# Patient Record
Sex: Female | Born: 1970 | Race: White | Hispanic: No | Marital: Married | Smoking: Never smoker
Health system: Southern US, Community
[De-identification: ages and names within clinical notes are randomized; demographics above are authoritative.]

## PROBLEM LIST (undated history)

## (undated) DIAGNOSIS — J45909 Unspecified asthma, uncomplicated: Secondary | ICD-10-CM

## (undated) DIAGNOSIS — Z8619 Personal history of other infectious and parasitic diseases: Secondary | ICD-10-CM

## (undated) DIAGNOSIS — I10 Essential (primary) hypertension: Secondary | ICD-10-CM

## (undated) HISTORY — DX: Personal history of other infectious and parasitic diseases: Z86.19

## (undated) HISTORY — PX: TONSILLECTOMY: SUR1361

---
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
## 1998-12-29 ENCOUNTER — Inpatient Hospital Stay (HOSPITAL_COMMUNITY): Admission: AD | Admit: 1998-12-29 | Discharge: 1998-12-31 | Payer: Self-pay | Admitting: Obstetrics & Gynecology

## 1999-03-05 ENCOUNTER — Other Ambulatory Visit: Admission: RE | Admit: 1999-03-05 | Discharge: 1999-03-05 | Payer: Self-pay | Admitting: Obstetrics and Gynecology

## 2000-04-10 ENCOUNTER — Other Ambulatory Visit: Admission: RE | Admit: 2000-04-10 | Discharge: 2000-04-10 | Payer: Self-pay | Admitting: Obstetrics and Gynecology

## 2001-06-15 ENCOUNTER — Other Ambulatory Visit: Admission: RE | Admit: 2001-06-15 | Discharge: 2001-06-15 | Payer: Self-pay | Admitting: Obstetrics and Gynecology

## 2001-12-21 ENCOUNTER — Emergency Department (HOSPITAL_COMMUNITY): Admission: EM | Admit: 2001-12-21 | Discharge: 2001-12-22 | Payer: Self-pay | Admitting: Emergency Medicine

## 2002-01-07 ENCOUNTER — Inpatient Hospital Stay (HOSPITAL_COMMUNITY): Admission: AD | Admit: 2002-01-07 | Discharge: 2002-01-10 | Payer: Self-pay | Admitting: Obstetrics and Gynecology

## 2002-02-28 ENCOUNTER — Other Ambulatory Visit: Admission: RE | Admit: 2002-02-28 | Discharge: 2002-02-28 | Payer: Self-pay | Admitting: Obstetrics and Gynecology

## 2003-02-22 ENCOUNTER — Other Ambulatory Visit: Admission: RE | Admit: 2003-02-22 | Discharge: 2003-02-22 | Payer: Self-pay | Admitting: Obstetrics and Gynecology

## 2003-09-15 ENCOUNTER — Inpatient Hospital Stay (HOSPITAL_COMMUNITY): Admission: AD | Admit: 2003-09-15 | Discharge: 2003-09-15 | Payer: Self-pay | Admitting: Obstetrics and Gynecology

## 2003-09-16 ENCOUNTER — Inpatient Hospital Stay (HOSPITAL_COMMUNITY): Admission: AD | Admit: 2003-09-16 | Discharge: 2003-09-18 | Payer: Self-pay | Admitting: *Deleted

## 2003-10-24 ENCOUNTER — Other Ambulatory Visit: Admission: RE | Admit: 2003-10-24 | Discharge: 2003-10-24 | Payer: Self-pay | Admitting: Obstetrics and Gynecology

## 2005-07-14 HISTORY — PX: BREAST SURGERY: SHX581

## 2013-09-16 ENCOUNTER — Encounter (HOSPITAL_COMMUNITY): Payer: Self-pay | Admitting: Emergency Medicine

## 2013-09-16 ENCOUNTER — Emergency Department (HOSPITAL_COMMUNITY)
Admission: EM | Admit: 2013-09-16 | Discharge: 2013-09-16 | Disposition: A | Discharge status: Home / Self Care | Payer: 59 | Source: Home / Self Care | Attending: Family Medicine | Admitting: Family Medicine

## 2013-09-16 DIAGNOSIS — J029 Acute pharyngitis, unspecified: Secondary | ICD-10-CM

## 2013-09-16 DIAGNOSIS — J019 Acute sinusitis, unspecified: Secondary | ICD-10-CM

## 2013-09-16 HISTORY — DX: Unspecified asthma, uncomplicated: J45.909

## 2013-09-16 MED ORDER — IBUPROFEN 800 MG PO TABS
800.0000 mg | ORAL_TABLET | Freq: Once | ORAL | Status: AC
  Administered 2013-09-16: 800 mg via ORAL

## 2013-09-16 MED ORDER — IBUPROFEN 800 MG PO TABS
ORAL_TABLET | ORAL | Status: AC
  Filled 2013-09-16: qty 1

## 2013-09-16 MED ORDER — AMOXICILLIN 500 MG PO CAPS: 1000.0000 mg | ORAL_CAPSULE | Freq: Three times a day (TID) | ORAL | Status: DC

## 2013-09-16 MED ORDER — IBUPROFEN 800 MG PO TABS: 800.0000 mg | ORAL_TABLET | Freq: Once | ORAL | Status: DC

## 2013-09-16 NOTE — ED Provider Notes (Signed)
CSN: [id number]     Arrival date & time 09/16/13  0915 History   First MD Initiated Contact with Patient 09/16/13 0957     Chief Complaint  Patient presents with  . URI    Patient is a 43 y.o. female presenting with URI. The history is provided by the patient.  URI Presenting symptoms: congestion, ear pain, facial pain, fatigue, fever and sore throat   Severity:  Moderate Onset quality:  Gradual Duration:  5 days Timing:  Constant Progression:  Worsening Chronicity:  New Relieved by:  Nothing Ineffective treatments:  OTC medications Associated symptoms: neck pain, sinus pain and swollen glands   Associated symptoms: no arthralgias, no headaches, no myalgias, no sneezing and no wheezing   Risk factors: chronic respiratory disease   Risk factors: not elderly, no chronic cardiac disease, no chronic kidney disease, no diabetes mellitus, no immunosuppression, no recent illness, no recent travel and no sick contacts   Onset of sinus congestion Monday that has not been relieved w/ Netty Pot and several OTC meds. Now with bil ear "pressure", facial pain R>L, sore throat and sore neck glands. Sx's associated w/ fever and chills, t-max at home unknown. Denies N/V/D or other associated sx's.  Past Medical History  Diagnosis Date  . Asthma    Past Surgical History  Procedure Laterality Date  . Tonsillectomy    . Breast surgery     No family history on file. History  Substance Use Topics  . Smoking status: Never Smoker   . Smokeless tobacco: Not on file  . Alcohol Use: No   OB History   Grav Para Term Preterm Abortions TAB SAB Ect Mult Living                 Review of Systems  Constitutional: Positive for fever, chills and fatigue.  HENT: Positive for congestion, ear pain, sinus pressure and sore throat. Negative for sneezing and trouble swallowing.   Eyes: Negative.   Respiratory: Negative.  Negative for wheezing.   Cardiovascular: Negative.   Gastrointestinal: Negative.    Endocrine: Negative.   Genitourinary: Negative.   Musculoskeletal: Positive for neck pain. Negative for arthralgias and myalgias.  Allergic/Immunologic: Negative.   Neurological: Negative.  Negative for headaches.  Hematological: Negative.   Psychiatric/Behavioral: Negative.     Allergies  Review of patient's allergies indicates no known allergies.  Home Medications   Current Outpatient Rx  Name  Route  Sig  Dispense  Refill  . albuterol-ipratropium (COMBIVENT) 18-103 MCG/ACT inhaler   Inhalation   Inhale into the lungs every 4 (four) hours.         . montelukast (SINGULAIR) 10 MG tablet   Oral   Take 10 mg by mouth at bedtime.         Marland Kitchen. amoxicillin (AMOXIL) 500 MG capsule   Oral   Take 2 capsules (1,000 mg total) by mouth 3 (three) times daily.   60 capsule   0    BP 145/87  Pulse 114  Temp(Src) 101.6 F (38.7 C) (Oral)  Resp 20  SpO2 100%  LMP 09/10/2013 Physical Exam  Nursing note and vitals reviewed. Constitutional: She is oriented to person, place, and time. She appears well-developed and well-nourished. No distress.  HENT:  Head: Normocephalic and atraumatic.  Right Ear: Tympanic membrane, external ear and ear canal normal.  Left Ear: Tympanic membrane and ear canal normal.  Nose: Mucosal edema present. Right sinus exhibits maxillary sinus tenderness and frontal sinus tenderness. Left sinus  exhibits maxillary sinus tenderness and frontal sinus tenderness.  Mouth/Throat: Uvula is midline and mucous membranes are normal. Mucous membranes are not dry. Posterior oropharyngeal edema and posterior oropharyngeal erythema present. No tonsillar abscesses.  Eyes: Conjunctivae are normal.  Neck: Normal range of motion.  Cardiovascular: Regular rhythm.  Tachycardia present.  Exam reveals no gallop and no friction rub.   No murmur heard. Likely driven by fever.   Pulmonary/Chest: Effort normal and breath sounds normal.  Lymphadenopathy:    She has cervical  adenopathy.  Neurological: She is alert and oriented to person, place, and time.  Skin: Skin is warm and dry. No rash noted.  Psychiatric: She has a normal mood and affect.    ED Course  Procedures (including critical care time) Labs Review Labs Reviewed - No data to display Imaging Review No results found.   MDM   1. Sinusitis, acute   2. Pharyngitis    Approx 1 week h/o sore throat and sinus congestion associated w/ pain and fever. Symptoms refractory to OTC remedies. Will treat w/ Amoxicillin 1 gm TID x 10 days and encourage pt to continue Tylenol and Ibuprofen for fever and other palliative measures already using. Pt verbalizes understanding and is agreeable w/ plan.  Leanne Chang, NP 09/16/13 1408

## 2013-09-16 NOTE — Discharge Instructions (Signed)
Please read over the instructions below for home care. Take the medication as directed and be sure to finish. Continue netty pot and saline nasal spray (or Afrin spray if needed) until sinus congestion improves.  Alternate Tylenol and Ibuprofen for fever and pain.   Pharyngitis Pharyngitis is a sore throat (pharynx). There is redness, pain, and swelling of your throat. HOME CARE   Drink enough fluids to keep your pee (urine) clear or pale yellow.  Only take medicine as told by your doctor.  You may get sick again if you do not take medicine as told. Finish your medicines, even if you start to feel better.  Do not take aspirin.  Rest.  Rinse your mouth (gargle) with salt water ( tsp of salt per 1 qt of water) every 1 2 hours. This will help the pain.  If you are not at risk for choking, you can suck on hard candy or sore throat lozenges. GET HELP IF:  You have large, tender lumps on your neck.  You have a rash.  You cough up green, yellow-brown, or bloody spit. GET HELP RIGHT AWAY IF:   You have a stiff neck.  You drool or cannot swallow liquids.  You throw up (vomit) or are not able to keep medicine or liquids down.  You have very bad pain that does not go away with medicine.  You have problems breathing (not from a stuffy nose). MAKE SURE YOU:   Understand these instructions.  Will watch your condition.  Will get help right away if you are not doing well or get worse. Document Released: 12/17/2007 Document Revised: 04/20/2013 Document Reviewed: 03/07/2013 [facility] Patient Information 2014 [place], [place].  Salt Water Gargle This solution will help make your mouth and throat feel better. HOME CARE INSTRUCTIONS   Mix 1 teaspoon of salt in 8 ounces of warm water.  Gargle with this solution as much or often as you need or as directed. Swish and gargle gently if you have any sores or wounds in your mouth.  Do not swallow this mixture. Document Released: 04/03/2004  Document Revised: 09/22/2011 Document Reviewed: 08/25/2008 [facility] Patient Information 2014 [place], [place].  Sinusitis Sinusitis is redness, soreness, and puffiness (inflammation) of the air pockets in the bones of your face (sinuses). The redness, soreness, and puffiness can cause air and mucus to get trapped in your sinuses. This can allow germs to grow and cause an infection.  HOME CARE   Drink enough fluids to keep your pee (urine) clear or pale yellow.  Use a humidifier in your home.  Run a hot shower to create steam in the bathroom. Sit in the bathroom with the door closed. Breathe in the steam 3 4 times a day.  Put a warm, moist washcloth on your face 3 4 times a day, or as told by your doctor.  Use salt water sprays (saline sprays) to wet the thick fluid in your nose. This can help the sinuses drain.  Only take medicine as told by your doctor. GET HELP RIGHT AWAY IF:   Your pain gets worse.  You have very bad headaches.  You are sick to your stomach (nauseous).  You throw up (vomit).  You are very sleepy (drowsy) all the time.  Your face is puffy (swollen).  Your vision changes.  You have a stiff neck.  You have trouble breathing. MAKE SURE YOU:   Understand these instructions.  Will watch your condition.  Will get help right away if you  are not doing well or get worse. Document Released: 12/17/2007 Document Revised: 03/24/2012 Document Reviewed: 02/03/2012 [facility] Patient Information 2014 [place], [place].  Upper Respiratory Infection, Adult An upper respiratory infection (URI) is also known as the common cold. It is often caused by a type of germ (virus). Colds are easily spread (contagious). You can pass it to others by kissing, coughing, sneezing, or drinking out of the same glass. Usually, you get better in 1 or 2 weeks.  HOME CARE   Only take medicine as told by your doctor.  Use a warm mist humidifier or breathe in steam from a hot  shower.  Drink enough water and fluids to keep your pee (urine) clear or pale yellow.  Get plenty of rest.  Return to work when your temperature is back to normal or as told by your doctor. You may use a face mask and wash your hands to stop your cold from spreading. GET HELP RIGHT AWAY IF:   After the first few days, you feel you are getting worse.  You have questions about your medicine.  You have chills, shortness of breath, or brown or red spit (mucus).  You have yellow or brown snot (nasal discharge) or pain in the face, especially when you bend forward.  You have a fever, puffy (swollen) neck, pain when you swallow, or white spots in the back of your throat.  You have a bad headache, ear pain, sinus pain, or chest pain.  You have a high-pitched whistling sound when you breathe in and out (wheezing).  You have a lasting cough or cough up blood.  You have sore muscles or a stiff neck. MAKE SURE YOU:   Understand these instructions.  Will watch your condition.  Will get help right away if you are not doing well or get worse. Document Released: 12/17/2007 Document Revised: 09/22/2011 Document Reviewed: 11/04/2010 [facility] Patient Information 2014 [place], [place].

## 2013-09-16 NOTE — ED Notes (Signed)
Patient complains of head congestion, sinus and ear pressure; also sore throat since Monday with fever/chills; denies nausea/vomitting, diarrhea.

## 2013-09-16 NOTE — ED Provider Notes (Signed)
Medical screening examination/treatment/procedure(s) were performed by resident physician or non-physician practitioner and as supervising physician I was immediately available for consultation/collaboration.   KINDL,JAMES DOUGLAS MD.   James D Kindl, MD 09/16/13 1709 

## 2014-03-15 ENCOUNTER — Emergency Department (HOSPITAL_COMMUNITY)
Admission: EM | Admit: 2014-03-15 | Discharge: 2014-03-15 | Disposition: A | Discharge status: Home / Self Care | Payer: 59 | Source: Home / Self Care | Attending: Family Medicine | Admitting: Family Medicine

## 2014-03-15 ENCOUNTER — Encounter (HOSPITAL_COMMUNITY): Payer: Self-pay | Admitting: Emergency Medicine

## 2014-03-15 DIAGNOSIS — H00036 Abscess of eyelid left eye, unspecified eyelid: Secondary | ICD-10-CM

## 2014-03-15 DIAGNOSIS — H00039 Abscess of eyelid unspecified eye, unspecified eyelid: Secondary | ICD-10-CM

## 2014-03-15 MED ORDER — TOBRAMYCIN 0.3 % OP SOLN
OPHTHALMIC | Status: AC
  Filled 2014-03-15: qty 5

## 2014-03-15 MED ORDER — TOBRAMYCIN 0.3 % OP SOLN
2.0000 [drp] | Freq: Once | OPHTHALMIC | Status: AC
  Administered 2014-03-15: 2 [drp] via OPHTHALMIC

## 2014-03-15 NOTE — ED Provider Notes (Signed)
CSN: [id number]     Arrival date & time 03/15/14  1915 History   First MD Initiated Contact with Patient 03/15/14 1946     Chief Complaint  Patient presents with  . Eye Problem   (Consider location/radiation/quality/duration/timing/severity/associated sxs/prior Treatment) HPI Comments: Patient reports three of her children have recently had cutaneous abscesses that required recent incision and drainage. Patient reports that she developed redness and swelling of left upper medial eyelid 3 days ago that has become progressively worse with some cracking and drainage of skin at medial canthus. Denies contact lens use, fever, vision changes, pain with eye movement.  PCP: in [place], [place] Reports herself to be otherwise healthy.   Patient is a 43 y.o. female presenting with eye problem.  Eye Problem Location:  L eye   Past Medical History  Diagnosis Date  . Asthma    Past Surgical History  Procedure Laterality Date  . Tonsillectomy    . Breast surgery     History reviewed. No pertinent family history. History  Substance Use Topics  . Smoking status: Never Smoker   . Smokeless tobacco: Not on file  . Alcohol Use: No   OB History   Grav Para Term Preterm Abortions TAB SAB Ect Mult Living                 Review of Systems  All other systems reviewed and are negative.   Allergies  Review of patient's allergies indicates no known allergies.  Home Medications   Prior to Admission medications   Medication Sig Start Date End Date Taking? Authorizing Provider  albuterol-ipratropium (COMBIVENT) 18-103 MCG/ACT inhaler Inhale into the lungs every 4 (four) hours.   Yes Historical Provider, MD  montelukast (SINGULAIR) 10 MG tablet Take 10 mg by mouth at bedtime.   Yes Historical Provider, MD  amoxicillin (AMOXIL) 500 MG capsule Take 2 capsules (1,000 mg total) by mouth 3 (three) times daily. 09/16/13   Roma Kayser Schorr, NP   BP 140/94  Pulse 79  Temp(Src) 99.4 F (37.4 C) (Oral)   Resp 16  SpO2 98%  LMP 03/14/2014 Physical Exam  Nursing note and vitals reviewed. Constitutional: She is oriented to person, place, and time. She appears well-developed and well-nourished. No distress.  HENT:  Head: Normocephalic and atraumatic.  Eyes: Conjunctivae and EOM are normal. Pupils are equal, round, and reactive to light. Right eye exhibits no chemosis, no discharge, no exudate and no hordeolum. No scleral icterus.    Neck: Normal range of motion.  Cardiovascular: Normal rate.   Pulmonary/Chest: Effort normal.  Neurological: She is alert and oriented to person, place, and time.  Skin: Skin is warm and dry.  Psychiatric: She has a normal mood and affect. Her behavior is normal.    ED Course  Procedures (including critical care time) Labs Review Labs Reviewed - No data to display  Imaging Review No results found.   MDM   1. Cellulitis of eyelid, left    Will cover for possible cutaneous skin infection with Tobrex opthalmic drops (2 gtts in left eye Q4hrs x 7 days). Meds provided at [facility]. PCP follow up if no improvement. Warm compresses TID until healed.     Ria Clock, [place] 03/15/14 2021

## 2014-03-15 NOTE — ED Notes (Signed)
C/o left upper eyelid irritated, puffy. Denies visual disturbances. Both kids in home recently had staph infections , and she is concerned for poss skin infection. Denies pain

## 2014-03-15 NOTE — Discharge Instructions (Signed)
Tobrex opthalmic 2 drops in left eye every 4 hours for the next 7 days. Warm compresses three time a day until healed. Follow up with your doctor if symptoms do not improve.    Periorbital Cellulitis Periorbital cellulitis is a common infection that can affect the eyelid and the soft tissues that surround the eyeball. The infection may also affect the structures that produce and drain tears. It does not affect the eyeball itself. Natural tissue barriers usually prevent the spread of this infection to the eyeball and other deeper areas of the eye socket.  CAUSES  Bacterial infection.  Long-term (chronic) sinus infections.  An object (foreign body) stuck behind the eye.  An injury that goes through the eyelid tissues.  An injury that causes an infection, such as an insect sting.  Fracture of the bone around the eye.  Infections which have spread from the eyelid or other structures around the eye.  Bite wounds.  Inflammation or infection of the lining membranes of the brain (meningitis).  An infection in the blood (septicemia).  Dental infection (abscess).  Viral infection (this is rare). SYMPTOMS Symptoms usually come on suddenly.  Pain in the eye.  Red, hot, and swollen eyelids and possibly cheeks. The swelling is sometimes bad enough that the eyelids cannot open. Some infections make the eyelids look purple.  Fever and feeling generally ill.  Pain when touching the area around the eye. DIAGNOSIS  Periorbital cellulitis can be diagnosed from an eye exam. In severe cases, your caregiver might suggest:  Blood tests.  Imaging tests (such as a CT scan) to examine the sinuses and the area around and behind the eyeball. TREATMENT If your caregiver feels that you do not have any signs of serious infection, treatment may include:  Antibiotics.  Nasal decongestants to reduce swelling.  Referral to a dentist if it is suspected that the infection was caused by a prior  tooth infection.  Examination every day to make sure the problem is improving. HOME CARE INSTRUCTIONS  Take your antibiotics as directed. Finish them even if you start to feel better.  Some pain is normal with this condition. Take pain medicine as directed by your caregiver. Only take pain medicines approved by your caregiver.  It is important to drink fluids. Drink enough water and fluids to keep your urine clear or pale yellow.  Do not smoke.  Rest and get plenty of sleep.  Mild or moderate fevers generally have no long-term effects and often do not require treatment.  If your caregiver has given you a follow-up appointment, it is very important to keep that appointment. Your caregiver will need to make sure that the infection is getting better. It is important to check that a more serious infection is not developing. SEEK IMMEDIATE MEDICAL CARE IF:  Your eyelids become more painful, red, warm, or swollen.  You develop double vision or your vision becomes blurred or worsens in any way.  You have trouble moving your eyes.  The eye looks like it is popping out (proptosis).  You develop a severe headache, severe neck pain, or neck stiffness.  You develop repeated vomiting.  You have a fever or persistent symptoms for more than 72 hours.  You have a fever and your symptoms suddenly get worse. MAKE SURE YOU:  Understand these instructions.  Will watch your condition.  Will get help right away if you are not doing well or get worse. Document Released: 08/02/2010 Document Revised: 09/22/2011 Document Reviewed: 08/02/2010 ExitCare  Patient Information ©2015 [employer], LLC. This information is not intended to replace advice given to you by your health care provider. Make sure you discuss any questions you have with your health care provider. ° °

## 2014-03-16 NOTE — ED Provider Notes (Signed)
Medical screening examination/treatment/procedure(s) were performed by a resident physician or non-physician practitioner and as the supervising physician I was immediately available for consultation/collaboration.  Karolee Meloni, MD Family Medicine   Makinlee Awwad J Osa Campoli, MD 03/16/14 1800 

## 2014-12-22 ENCOUNTER — Other Ambulatory Visit: Payer: Self-pay | Admitting: Family Medicine

## 2014-12-25 ENCOUNTER — Encounter: Payer: Self-pay | Admitting: Family Medicine

## 2014-12-25 ENCOUNTER — Ambulatory Visit (INDEPENDENT_AMBULATORY_CARE_PROVIDER_SITE_OTHER): Payer: 59 | Admitting: Family Medicine

## 2014-12-25 VITALS — BP 120/80 | HR 89 | Temp 99.0°F | Resp 16 | Ht 65.0 in | Wt 211.0 lb

## 2014-12-25 DIAGNOSIS — J301 Allergic rhinitis due to pollen: Secondary | ICD-10-CM | POA: Diagnosis not present

## 2014-12-25 DIAGNOSIS — J4530 Mild persistent asthma, uncomplicated: Secondary | ICD-10-CM | POA: Diagnosis not present

## 2014-12-25 DIAGNOSIS — E669 Obesity, unspecified: Secondary | ICD-10-CM | POA: Insufficient documentation

## 2014-12-25 DIAGNOSIS — Z8249 Family history of ischemic heart disease and other diseases of the circulatory system: Secondary | ICD-10-CM | POA: Insufficient documentation

## 2014-12-25 DIAGNOSIS — K219 Gastro-esophageal reflux disease without esophagitis: Secondary | ICD-10-CM | POA: Insufficient documentation

## 2014-12-25 MED ORDER — IPRATROPIUM-ALBUTEROL 20-100 MCG/ACT IN AERS: 1.0000 | INHALATION_SPRAY | Freq: Four times a day (QID) | RESPIRATORY_TRACT | Status: DC | PRN

## 2014-12-25 MED ORDER — FLUTICASONE PROPIONATE HFA 110 MCG/ACT IN AERO: 2.0000 | INHALATION_SPRAY | Freq: Every day | RESPIRATORY_TRACT | Status: DC

## 2014-12-25 NOTE — Progress Notes (Signed)
   Patient: Wendy Shannon Female    DOB: 03-Feb-1971   44 y.o.   MRN: [id number] Visit Date: 12/25/2014  Today's Provider: Mila Merry, MD   No chief complaint on file.  Subjective:     Last seen for Asthma 04/25/2015=no changes were made.  Asthma She complains of shortness of breath and wheezing. There is no chest tightness, cough, difficulty breathing, frequent throat clearing or sputum production. This is a chronic problem. The current episode started more than 1 year ago. The problem occurs every several days. The problem has been unchanged. Pertinent negatives include no appetite change, chest pain, dyspnea on exertion, ear congestion, ear pain, fever, headaches, heartburn, malaise/fatigue, nasal congestion, sneezing or trouble swallowing. Her symptoms are aggravated by change in weather and pollen. She reports significant improvement on treatment. Her past medical history is significant for asthma.   She states that Singulair is working very well to keep allergies under control and having to use inhalers much less frequently since she starting taking it. She uses Combivent 3-4 days a week most of the year, but nearly every day during the fall and spring allergy seasons, It remains very effective. She does wake at night with symptoms 1-2 times per month.    Previous Medications   ALBUTEROL-IPRATROPIUM (COMBIVENT) 18-103 MCG/ACT INHALER    Inhale into the lungs every 4 (four) hours.   AMOXICILLIN (AMOXIL) 500 MG CAPSULE    Take 2 capsules (1,000 mg total) by mouth 3 (three) times daily.   COMBIVENT RESPIMAT 20-100 MCG/ACT AERS RESPIMAT    Use 1 puff 4 times daily   MONTELUKAST (SINGULAIR) 10 MG TABLET    Take 10 mg by mouth at bedtime.    Review of Systems  Constitutional: Negative for fever, malaise/fatigue and appetite change.  HENT: Negative for ear pain, sneezing and trouble swallowing.   Respiratory: Positive for shortness of breath and wheezing. Negative for cough and sputum  production.   Cardiovascular: Negative for chest pain and dyspnea on exertion.  Gastrointestinal: Negative for heartburn.  Neurological: Negative for headaches.    History  Substance Use Topics  . Smoking status: Never Smoker   . Smokeless tobacco: Not on file  . Alcohol Use: No   Objective:   There were no vitals taken for this visit.  Physical Exam  General Appearance:    Alert, cooperative, no distress  Eyes:    PERRL, conjunctiva/corneas clear, EOM's intact       Lungs:     Clear to auscultation bilaterally, respirations unlabored  Heart:    Regular rate and rhythm  Neurologic:   Awake, alert, oriented x 3. No apparent focal neurological           defect.            Assessment & Plan:     1. Mild persistent asthma in adult without complication Combivent continues to work well with Singulair for maintenance, but still having sx several times a weeks. Advised her to start maintenance inhaler whenever she has sx 3 or more times a weeks, especially during the allergy season when she has daily symptoms.   Counseled patient that she should have pneumonia vaccine to reduce risk of asthma exacerbations and LRI. She declined to get this today, but said she will think about it.   2. Allergic rhinitis due to pollen Continue Singulair daily which remains effective.

## 2015-01-17 ENCOUNTER — Encounter: Payer: Self-pay | Admitting: Family Medicine

## 2015-01-17 ENCOUNTER — Ambulatory Visit (INDEPENDENT_AMBULATORY_CARE_PROVIDER_SITE_OTHER): Payer: 59 | Admitting: Family Medicine

## 2015-01-17 VITALS — BP 102/80 | HR 64 | Temp 98.5°F | Resp 16 | Ht 65.0 in | Wt 212.0 lb

## 2015-01-17 DIAGNOSIS — J0100 Acute maxillary sinusitis, unspecified: Secondary | ICD-10-CM

## 2015-01-17 MED ORDER — AMOXICILLIN-POT CLAVULANATE 875-125 MG PO TABS: 1.0000 | ORAL_TABLET | Freq: Two times a day (BID) | ORAL | Status: DC

## 2015-01-17 NOTE — Progress Notes (Signed)
Subjective:     Patient ID: Wendy Shannon, female   DOB: 07-30-1970, 44 y.o.   MRN: [id number]  HPI  Chief Complaint  Patient presents with  . Sinus Problem    patient comes in office today with concerns of sinus pain and pressure since 01/07/15. Patient complains of sinus pain on right side of face behind her eye and pain and pressure in right ear. [place] and reports cold like symptoms during that time of travel  Patient reports increased right sided sinus pressure and purulent sinus drainage.   Review of Systems  Constitutional: Negative for fever and chills.  Respiratory: Negative for cough.        Asthma under control with Singulair.       Objective:   Physical Exam Ears: T.M's intact without inflammation. R TM dull in appearance Sinuses: non-tender Throat: no tonsillar enlargement or exudate Neck: no cervical adenopathy Lungs: clear    Assessment:    1. Acute maxillary sinusitis, recurrence not specified - amoxicillin-clavulanate (AUGMENTIN) 875-125 MG per tablet; Take 1 tablet by mouth 2 (two) times daily.  Dispense: 20 tablet; Refill: 0    Plan:    discussed use of Mucinex D

## 2015-01-17 NOTE — Patient Instructions (Signed)
Try Mucinex D.

## 2015-03-15 ENCOUNTER — Ambulatory Visit (INDEPENDENT_AMBULATORY_CARE_PROVIDER_SITE_OTHER): Payer: 59 | Admitting: Family Medicine

## 2015-03-15 ENCOUNTER — Encounter: Payer: Self-pay | Admitting: Family Medicine

## 2015-03-15 VITALS — BP 112/76 | HR 85 | Temp 98.0°F | Resp 14 | Ht 65.5 in | Wt 208.8 lb

## 2015-03-15 DIAGNOSIS — S46912A Strain of unspecified muscle, fascia and tendon at shoulder and upper arm level, left arm, initial encounter: Secondary | ICD-10-CM

## 2015-03-15 MED ORDER — HYDROCODONE-ACETAMINOPHEN 5-325 MG PO TABS: ORAL_TABLET | ORAL | Status: DC

## 2015-03-15 NOTE — Patient Instructions (Addendum)
Discussed scheduling of Aleve two pills twice daily with addition of heat for 20 minutes several x day. Continue to minimize lifting and pulling.

## 2015-03-15 NOTE — Progress Notes (Signed)
Subjective:     Patient ID: Wendy Shannon, female   DOB: [birth year], 44 y.o.   MRN: [id number]  HPI  Chief Complaint  Patient presents with  . Shoulder Pain    Patient comes in office today with concerns of shoulder pain on the left side that has been radiating down arm. Patient states that pain began 3 weeks ago she denies injury or oversue of arm., she describes pain as sharp/aching. Patient reports that she has heard "popping" at times in her shoulder, she has tried taking otc Aleve for relief.  States she has been taking Aleve two pills 3 - 4 x day. 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 Believes that may have exacerbated her sx. Tends to localize over her left AC joint area.   Review of Systems     Objective:   Physical Exam  Constitutional: She appears well-developed and well-nourished. No distress.  Musculoskeletal:  Cervical FROM. Left shoulder AROM with increased pain > 90 degrees. Passive FROM. Mild tenderness over the [facility] joint area though no crepitus appreciated on palpation. Left shoulder strength 5/5.       Assessment:    1. Shoulder strain, left, initial encounter - HYDROcodone-acetaminophen (NORCO/VICODIN) 5-325 MG per tablet; One every 4-6 hours as needed for pain  Dispense: 28 tablet; Refill: 0    Plan:    Continue two Aleve twice daily with food. Minimize lifting/pulling. Consider x-ray and orthopedic referral if not improving over the next 1-2 weeks

## 2015-04-27 ENCOUNTER — Encounter: Payer: Self-pay | Admitting: Family Medicine

## 2015-04-27 ENCOUNTER — Ambulatory Visit (INDEPENDENT_AMBULATORY_CARE_PROVIDER_SITE_OTHER): Payer: BC Managed Care – PPO | Admitting: Family Medicine

## 2015-04-27 VITALS — BP 128/82 | HR 80 | Temp 98.7°F | Resp 16 | Ht 65.5 in | Wt 215.0 lb

## 2015-04-27 DIAGNOSIS — J069 Acute upper respiratory infection, unspecified: Secondary | ICD-10-CM | POA: Diagnosis not present

## 2015-04-27 NOTE — Patient Instructions (Signed)
Try Mucinex D. If you develop cough add Delsym.

## 2015-04-27 NOTE — Progress Notes (Signed)
Subjective:     Patient ID: Wendy Shannon, female   DOB: [birth year], 44 y.o.   MRN: [id number]  HPI  Chief Complaint  Patient presents with  . Sore Throat    X 5 days. Patient reports that she woke up with a sore throat and cough. Patient also reports that she has been really hoarse. She has felt feverish, but did not take temp. She has only been taking OTC cold medication for symptoms .   Marland Kitchen. Rash    Patient also has a rash that has developed on her upper extremities. Patient reports that it is mildly itchy.   Denies sinus congestion or cough. Accompanied by her son.   Review of Systems  Respiratory:       States she remains on Singulair and uses Combivent one puff daily.       Objective:   Physical Exam  Constitutional: She appears well-developed and well-nourished. No distress.  Ears: T.M's intact without inflammation Throat: tonsils absent, no erythema Neck: no cervical adenopathy Lungs: clear Skin: slightly raised erythematous rash on her posterior neck and upper back    Assessment:    1. Upper respiratory infection with viral exanthem    Plan:    Discussed use of Mucinex D and Delsym for cough

## 2015-05-29 ENCOUNTER — Other Ambulatory Visit: Payer: Self-pay | Admitting: Family Medicine

## 2015-10-16 ENCOUNTER — Other Ambulatory Visit: Payer: Self-pay | Admitting: Family Medicine

## 2015-10-17 ENCOUNTER — Other Ambulatory Visit: Payer: Self-pay | Admitting: Family Medicine

## 2015-10-17 MED ORDER — MONTELUKAST SODIUM 10 MG PO TABS: 10.0000 mg | ORAL_TABLET | Freq: Every day | ORAL | Status: DC

## 2015-10-17 NOTE — Telephone Encounter (Signed)
rx failed to send to pharmacy. Resent rx.

## 2015-10-17 NOTE — Addendum Note (Signed)
Addended by: Marlene LardMILLER, APRIL M on: 10/17/2015 02:20 PM   Modules accepted: Orders

## 2016-01-25 ENCOUNTER — Other Ambulatory Visit: Payer: Self-pay

## 2016-01-25 MED ORDER — IPRATROPIUM-ALBUTEROL 20-100 MCG/ACT IN AERS: 1.0000 | INHALATION_SPRAY | Freq: Four times a day (QID) | RESPIRATORY_TRACT | Status: DC | PRN

## 2016-03-18 ENCOUNTER — Ambulatory Visit (INDEPENDENT_AMBULATORY_CARE_PROVIDER_SITE_OTHER): Payer: BC Managed Care – PPO

## 2016-03-18 ENCOUNTER — Telehealth: Payer: Self-pay

## 2016-03-18 ENCOUNTER — Ambulatory Visit (INDEPENDENT_AMBULATORY_CARE_PROVIDER_SITE_OTHER): Payer: Self-pay | Admitting: Physician Assistant

## 2016-03-18 VITALS — BP 130/88 | HR 115 | Temp 99.3°F | Resp 16 | Ht 64.5 in | Wt 218.0 lb

## 2016-03-18 DIAGNOSIS — J189 Pneumonia, unspecified organism: Secondary | ICD-10-CM

## 2016-03-18 DIAGNOSIS — R0789 Other chest pain: Secondary | ICD-10-CM

## 2016-03-18 LAB — POCT CBC
Granulocyte percent: 83.7 %G — AB (ref 37–80)
HCT, POC: 42.9 % (ref 37.7–47.9)
Hemoglobin: 15 g/dL (ref 12.2–16.2)
LYMPH, POC: 1.1 (ref 0.6–3.4)
MCH, POC: 28.8 pg (ref 27–31.2)
MCHC: 34.9 g/dL (ref 31.8–35.4)
MCV: 82.4 fL (ref 80–97)
MID (CBC): 0.4 (ref 0–0.9)
MPV: 7.1 fL (ref 0–99.8)
POC Granulocyte: 7.5 — AB (ref 2–6.9)
POC LYMPH PERCENT: 12.1 %L (ref 10–50)
POC MID %: 4.2 %M (ref 0–12)
Platelet Count, POC: 285 10*3/uL (ref 142–424)
RBC: 5.2 M/uL (ref 4.04–5.48)
RDW, POC: 13.7 %
WBC: 9 10*3/uL (ref 4.6–10.2)

## 2016-03-18 LAB — LIPID PANEL
CHOLESTEROL: 205 mg/dL — AB (ref 125–200)
HDL: 56 mg/dL (ref >=46)
LDL Cholesterol: 129 mg/dL (ref <130)
Total CHOL/HDL Ratio: 3.7 Ratio (ref <=5.0)
Triglycerides: 102 mg/dL (ref <150)
VLDL: 20 mg/dL (ref <30)

## 2016-03-18 LAB — POCT URINALYSIS DIP (MANUAL ENTRY)
Bilirubin, UA: NEGATIVE
Glucose, UA: NEGATIVE
Ketones, POC UA: NEGATIVE
Leukocytes, UA: NEGATIVE
NITRITE UA: NEGATIVE
PH UA: 5.5
PROTEIN UA: NEGATIVE
Spec Grav, UA: 1.015
UROBILINOGEN UA: 0.2

## 2016-03-18 LAB — COMPLETE METABOLIC PANEL WITH GFR
ALT: 14 U/L (ref 6–29)
AST: 15 U/L (ref 10–30)
Albumin: 4.7 g/dL (ref 3.6–5.1)
Alkaline Phosphatase: 71 U/L (ref 33–115)
BUN: 8 mg/dL (ref 7–25)
CALCIUM: 9.8 mg/dL (ref 8.6–10.2)
CO2: 25 mmol/L (ref 20–31)
CREATININE: 0.83 mg/dL (ref 0.50–1.10)
Chloride: 102 mmol/L (ref 98–110)
GFR, Est African American: >89 mL/min (ref >=60)
GFR, Est Non African American: 86 mL/min (ref >=60)
Glucose, Bld: 91 mg/dL (ref 65–99)
Potassium: 4.3 mmol/L (ref 3.5–5.3)
Sodium: 138 mmol/L (ref 135–146)
Total Bilirubin: 0.4 mg/dL (ref 0.2–1.2)
Total Protein: 8 g/dL (ref 6.1–8.1)

## 2016-03-18 LAB — POC MICROSCOPIC URINALYSIS (UMFC): Mucus: ABSENT

## 2016-03-18 MED ORDER — DOXYCYCLINE HYCLATE 100 MG PO CAPS: 100.0000 mg | ORAL_CAPSULE | Freq: Two times a day (BID) | ORAL | 0 refills | Status: DC

## 2016-03-18 NOTE — Progress Notes (Signed)
Wendy HawkingRebecca H Shannon  MRN: [id number] DOB: 22-May-1971  Subjective:  Wendy HawkingRebecca H Shannon is a 45 y.o. female seen in office today for a chief complaint of constant "pinching" anterior chest pain radiating to back x 3 days. Has associated fatigue, diaphoresis,  SOB, diarrhea, and general malaise. Denies fever, exertional chest pain, nausea, and vomiting. Her chest pain is not currently present. Pt denies smoking. She has a family history of MI in father, he passed away at age 45 from this. Of note, pt is going through a stressful time right now and thinks this may be stress related but is just concerned because of her father's past. 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 She has not been able to talk to the child and that has caused her a lot of stress. She is also going through a lot at work right now.   Review of Systems  Constitutional: Negative for chills.  Respiratory: Negative for cough and wheezing.   Cardiovascular: Positive for palpitations (skipping beats x 3 days). Negative for leg swelling.  Gastrointestinal: Negative for abdominal pain and diarrhea.  Neurological: Positive for dizziness and headaches. Negative for weakness. Light-headedness: for the past three days.    Patient Active Problem List   Diagnosis Date Noted  . GERD (gastroesophageal reflux disease) 12/25/2014  . Obesity 12/25/2014  . Mild persistent asthma in adult without complication 12/25/2014  . Family history of coronary artery disease 12/25/2014  . Allergic rhinitis due to pollen 12/25/2014  . Family history of premature CAD 12/25/2014    Current Outpatient Prescriptions on File Prior to Visit  Medication Sig Dispense Refill  . Ipratropium-Albuterol (COMBIVENT RESPIMAT) 20-100 MCG/ACT AERS respimat Inhale 1 puff into the lungs every 6 (six) hours as needed for wheezing. 3 Inhaler 1  . montelukast (SINGULAIR) 10 MG tablet Take 1 tablet (10 mg  total) by mouth daily. 30 tablet 6  . fluticasone (FLOVENT HFA) 110 MCG/ACT inhaler Inhale 2 puffs into the lungs daily. (Patient not taking: Reported on 03/18/2016) 3 Inhaler 3  . HYDROcodone-acetaminophen (NORCO/VICODIN) 5-325 MG per tablet One every 4-6 hours as needed for pain (Patient not taking: Reported on 03/18/2016) 28 tablet 0   No current facility-administered medications on file prior to visit.    Social History   Social History  . Marital status: Married    Spouse name: N/A  . Number of children: 4  . Years of education: N/A   Occupational History  . Not on file.   Social History Main Topics  . Smoking status: Never Smoker  . Smokeless tobacco: Not on file  . Alcohol use No  . Drug use: No  . Sexual activity: Yes   Other Topics Concern  . Not on file   Social History Narrative  . No narrative on file   Past Medical History:  Diagnosis Date  . Asthma   . History of chicken pox    No Known Allergies  Objective:  BP 130/88   Pulse (!) 115   Temp 99.3 F (37.4 C) (Oral)   Resp 16   Ht 5' 4.5" (1.638 m)   Wt 218 lb (98.9 kg)   SpO2 98%   BMI 36.84 kg/m   Physical Exam  Constitutional: She is oriented to person, place, and time.  Well developed, well nourished female appearing uncomfortable lying on exam table   HENT:  Head: Normocephalic and atraumatic.  Eyes: Conjunctivae  are normal.  Neck: Normal range of motion.  Cardiovascular: Regular rhythm, normal heart sounds and intact distal pulses.  Tachycardia present.   Pain is not reproducible with palpation   Pulmonary/Chest: Effort normal and breath sounds normal.  Abdominal: Soft. Normal appearance and bowel sounds are normal. There is no tenderness.  Lymphadenopathy:       Head (right side): No submental, no submandibular, no tonsillar, no preauricular, no posterior auricular and no occipital adenopathy present.       Head (left side): No submental, no submandibular, no tonsillar, no preauricular,  no posterior auricular and no occipital adenopathy present.    She has no cervical adenopathy.       Right: No supraclavicular adenopathy present.       Left: No supraclavicular adenopathy present.  Neurological: She is alert and oriented to person, place, and time. Gait normal.  Skin: Skin is dry.  Skin feels hot upon palpation    Psychiatric: Affect normal.  Vitals reviewed.   Results for orders placed or performed in visit on 03/18/16 (from the past 24 hour(s))  POCT CBC     Status: Abnormal   Collection Time: 03/18/16  3:29 PM  Result Value Ref Range   WBC 9.0 4.6 - 10.2 K/uL   Lymph, poc 1.1 0.6 - 3.4   POC LYMPH PERCENT 12.1 10 - 50 %L   MID (cbc) 0.4 0 - 0.9   POC MID % 4.2 0 - 12 %M   POC Granulocyte 7.5 (A) 2 - 6.9   Granulocyte percent 83.7 (A) 37 - 80 %G   RBC 5.20 4.04 - 5.48 M/uL   Hemoglobin 15.0 12.2 - 16.2 g/dL   HCT, POC 95.6 21.3 - 47.9 %   MCV 82.4 80 - 97 fL   MCH, POC 28.8 27 - 31.2 pg   MCHC 34.9 31.8 - 35.4 g/dL   RDW, POC 08.6 %   Platelet Count, POC 285 142 - 424 K/uL   MPV 7.1 0 - 99.8 fL  POCT urinalysis dipstick     Status: Abnormal   Collection Time: 03/18/16  3:30 PM  Result Value Ref Range   Color, UA yellow yellow   Clarity, UA clear clear   Glucose, UA negative negative   Bilirubin, UA negative negative   Ketones, POC UA negative negative   Spec Grav, UA 1.015    Blood, UA trace-intact (A) negative   pH, UA 5.5    Protein Ur, POC negative negative   Urobilinogen, UA 0.2    Nitrite, UA Negative Negative   Leukocytes, UA Negative Negative    Dg Chest 2 View  Result Date: 03/18/2016 CLINICAL DATA:  Chest pain EXAM: CHEST  2 VIEW COMPARISON:  None. FINDINGS: There is airspace consolidation in the anterior segment of the right lower lobe. Lungs elsewhere are clear. Heart size and pulmonary vascularity are normal. No adenopathy. No bone lesions. IMPRESSION: Space consolidation consistent with pneumonia in the anterior segment of the right  lower lobe. Lungs elsewhere clear. Cardiac silhouette within normal limits. Electronically Signed   By: Bretta Bang III M.D.   On: 03/18/2016 15:14    BP Readings from Last 3 Encounters:  03/18/16 130/88  04/27/15 128/82  03/15/15 112/76   EKG shows sinus tachycardia at 107 bpm   Assessment and Plan :  1. Other chest pain - EKG 12-Lead - DG Chest 2 View; Future - POCT CBC - COMPLETE METABOLIC PANEL WITH GFR - Lipid panel - POCT urinalysis  dipstick - POCT Microscopic Urinalysis ([facility])  2. CAP (community acquired pneumonia) - doxycycline (VIBRAMYCIN) 100 MG capsule; Take 1 capsule (100 mg total) by mouth 2 (two) times daily.  Dispense: 20 capsule; Refill: 0 -Follow up in one month for repeat xray -If symptoms worsen, seek care immediately   Benjiman Core PA-C  [facility] 03/18/2016 4:03 PM

## 2016-03-18 NOTE — Telephone Encounter (Signed)
Patient called office today with complaints  shortness of breath and tightness in chest over the past 3 days. 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 Patient states that her father has a history of CHF and died in his 8930's and she is scared. Patient denied fever, light headedness, dizziness, visual changes, headache or paresthesias. Patient reports she has symptoms of G.I upset, fatigue and malaise. I advised patient that our office has no openings today and she needs to seek treatment of condition is not improving ( patients breathing on the phone sounded labored). Patient was instructed to go to nearest urgent care for evaluation and treatment or to her nearest emergency room. Patient understood and was compliant, her call back number is [phone]. KW

## 2016-03-18 NOTE — Patient Instructions (Addendum)
Take antibiotics as prescribed Follow up in one month for repeat chest xray If symptoms worsen, seek care immediately.    IF you received an x-ray today, you will receive an invoice from [facility]. Please contact [facility] at [phone] with questions or concerns regarding your invoice.   IF you received labwork today, you will receive an invoice from [facility]. Please contact [facility] at [phone] with questions or concerns regarding your invoice.   Our billing staff will not be able to assist you with questions regarding bills from these companies.  You will be contacted with the lab results as soon as they are available. The fastest way to get your results is to activate your My Chart account. Instructions are located on the last page of this paperwork. If you have not heard from us regarding the results in 2 weeks, please contact this office.

## 2016-03-24 ENCOUNTER — Encounter: Payer: Self-pay | Admitting: Physician Assistant

## 2016-03-24 NOTE — Progress Notes (Signed)
Please send result letter to patient.

## 2016-03-28 ENCOUNTER — Encounter: Payer: Self-pay | Admitting: *Deleted

## 2016-05-02 ENCOUNTER — Ambulatory Visit (INDEPENDENT_AMBULATORY_CARE_PROVIDER_SITE_OTHER): Payer: BC Managed Care – PPO

## 2016-05-02 ENCOUNTER — Ambulatory Visit (INDEPENDENT_AMBULATORY_CARE_PROVIDER_SITE_OTHER): Payer: BC Managed Care – PPO | Admitting: Physician Assistant

## 2016-05-02 ENCOUNTER — Telehealth: Payer: Self-pay

## 2016-05-02 ENCOUNTER — Encounter: Payer: Self-pay | Admitting: Physician Assistant

## 2016-05-02 VITALS — BP 100/76 | HR 82 | Temp 98.3°F | Resp 17 | Ht 64.5 in | Wt 202.0 lb

## 2016-05-02 DIAGNOSIS — Z8701 Personal history of pneumonia (recurrent): Secondary | ICD-10-CM | POA: Diagnosis not present

## 2016-05-02 NOTE — Progress Notes (Signed)
    MRN: [id number] DOB: Apr 02, 1971  Subjective:   Wendy Shannon is a 45 y.o. female presenting for follow up on pneumonia. She was initially seen on 03/18/16 and diagnosed with CAP. Pt was given 10 day course of doxycycline and completed the entire course.  She notes that about 7 days after completing antibiotics she felt completely back to her old self. She has no associated symptoms today. Denies cough, fever, chills, diaphoresis, and chest pain. Since the last visit she has really worked on her health and lost 16 lbs via dietary changes. She is interested in starting yoga and pilates.   Wendy Shannon has a current medication list which includes the following prescription(s): ipratropium-albuterol and montelukast. Also has No Known Allergies.  Wendy Shannon  has a past medical history of Asthma and History of chicken pox. Also  has a past surgical history that includes Tonsillectomy; Breast surgery (Bilateral, 2007); and Tonsillectomy and adenoidectomy (1984).  Objective:   Vitals: BP 100/76 (BP Location: Left Arm, Patient Position: Sitting, Cuff Size: Large)   Pulse 82   Temp 98.3 F (36.8 C) (Oral)   Resp 17   Ht 5' 4.5" (1.638 m)   Wt 202 lb (91.6 kg)   SpO2 98%   BMI 34.14 kg/m   Physical Exam  Constitutional: She is oriented to person, place, and time. She appears well-developed and well-nourished.  HENT:  Head: Normocephalic and atraumatic.  Eyes: Conjunctivae are normal.  Neck: Normal range of motion.  Cardiovascular: Normal rate, regular rhythm and normal heart sounds.   Pulmonary/Chest: Effort normal and breath sounds normal. No respiratory distress. She has no wheezes. She has no rales.  Neurological: She is alert and oriented to person, place, and time.  Skin: Skin is warm and dry.  Psychiatric: She has a normal mood and affect.  Vitals reviewed.   Wt Readings from Last 3 Encounters:  05/02/16 202 lb (91.6 kg)  03/18/16 218 lb (98.9 kg)  04/27/15 215 lb (97.5 kg)   Dg  Chest 2 View  Result Date: 05/02/2016 CLINICAL DATA:  FU Pneumonia 6 weeks ago. EXAM: CHEST - 2 VIEW COMPARISON:  03/18/2016 FINDINGS: Virtually complete resolution of the anterior lateral right lower lobe airspace disease. Lungs are clear. Heart size and mediastinal contours are within normal limits. No effusion. Visualized bones unremarkable. IMPRESSION: 1. Resolution of right lower lobe infiltrate.  No acute disease. Electronically Signed   By: Corlis Leak  Hassell M.D.   On: 05/02/2016 09:16    Assessment and Plan :  1. History of pneumonia - DG Chest 2 View; Future -Return to clinic as needed  Benjiman CoreBrittany Wiseman, PA-C  [facility] 05/02/2016 9:19 AM

## 2016-05-02 NOTE — Patient Instructions (Addendum)
Continue the healthy lifestyle and if you decide to have us as a PCP, follow up in 6 months for an annual physical exam.  Thank you for letting me participate in your health and well being.    IF you received an x-ray today, you will receive an invoice from [facility]. Please contact [facility] at [phone] with questions or concerns regarding your invoice.   IF you received labwork today, you will receive an invoice from [facility]. Please contact [facility] at [phone] with questions or concerns regarding your invoice.   Our billing staff will not be able to assist you with questions regarding bills from these companies.  You will be contacted with the lab results as soon as they are available. The fastest way to get your results is to activate your My Chart account. Instructions are located on the last page of this paperwork. If you have not heard from us regarding the results in 2 weeks, please contact this office.

## 2016-05-02 NOTE — Telephone Encounter (Signed)
Msg is for Wendy Shannon,  Pt wanted to est. Care with her however when she called [employer], they could not find her. They said that she would have to call the management network number for [employer]. Once this information is figured out she want her daughter as well to est. Care.  Please advise  [phone]

## 2016-05-07 NOTE — Telephone Encounter (Signed)
Please call patient and let her know that with [employer] she may need to list a physician as a PCP not a Advice workerphysician assistant. If this is the case, she can list my supervising physician, Dr. Nilda SimmerKristi Shannon, but when she comes to our office she can request that I see her.

## 2016-05-08 NOTE — Telephone Encounter (Signed)
Voice Mail left for pt to return call about physician instead of Pa. Also left our number for call back.

## 2016-05-16 ENCOUNTER — Other Ambulatory Visit: Payer: Self-pay | Admitting: Family Medicine

## 2016-08-20 ENCOUNTER — Institutional Professional Consult (permissible substitution): Payer: BC Managed Care – PPO | Admitting: Medical

## 2016-08-20 ENCOUNTER — Ambulatory Visit (INDEPENDENT_AMBULATORY_CARE_PROVIDER_SITE_OTHER): Payer: BC Managed Care – PPO | Admitting: Physician Assistant

## 2016-08-20 VITALS — BP 130/96 | HR 95 | Temp 98.7°F | Resp 17 | Ht 64.5 in | Wt 192.0 lb

## 2016-08-20 DIAGNOSIS — R030 Elevated blood-pressure reading, without diagnosis of hypertension: Secondary | ICD-10-CM | POA: Diagnosis not present

## 2016-08-20 DIAGNOSIS — F4321 Adjustment disorder with depressed mood: Secondary | ICD-10-CM

## 2016-08-20 MED ORDER — ESCITALOPRAM OXALATE 10 MG PO TABS: ORAL_TABLET | ORAL | 0 refills | Status: DC

## 2016-08-20 NOTE — Patient Instructions (Addendum)
Start taking lexapro 5mg  daily x 1 week. Increase to 10mg  daily after one week. Follow up in 2 weeks for reevaluation. Side effects of this medication most commonly include GI upset. Take medication with food to help avoid this. These side effects typically resolve after 2 weeks. If you have any suicidal thoughts or plans while on this medication please call 911 immediately. You can contact our office or me directly through mychart over the next two weeks if you need anything.   In the meantime, when you are having moments of panic attacks, please use your xanax. You can even use half a tablet initially during these panic moments to see if this helps.  Local therapists info is below if you feel as if you need to seek further counseling:  [facility] [address] [place] [address]   Shanon RosserBarbara Farran [phone]  Maris BergerKathy Kirstner [phone]  Marco CollieSusan Kroll-Smith [phone]  For elevated bp, make sure you keep an eye on this out of the office over the next two weeks. Your goal is <140/90. If you start to experience any chest pain, severe headache, or visual problems seek care immediately.      IF you received an x-ray today, you will receive an invoice from [facility]. Please contact [facility] at [phone] with questions or concerns regarding your invoice.   IF you received labwork today, you will receive an invoice from [facility]. Please contact [facility] at [phone] with questions or concerns regarding your invoice.   Our billing staff will not be able to assist you with questions regarding bills from these companies.  You will be contacted with the lab results as soon as they are available. The fastest way to get your results is to activate your My Chart account. Instructions are located on the last page of this paperwork. If you have not heard from us regarding the results in 2 weeks, please contact this office.

## 2016-08-20 NOTE — Progress Notes (Signed)
Wendy Shannon  MRN: [id number] DOB: 1971/01/12  Subjective:  Wendy Shannon is a 46 y.o. female seen in office today for a chief complaint of depression x 2 weeks. She is also having associated panic attacks.  She found out 2 weeks ago that her husband of 25 years has been having an affair on and off for the past 5 years. Prior to this, pt had no history of depression or anxiety. Pt has received STD testing as soon as she found this out and was negative. She has also started attending counseling twice a week with her church pastor (one day is solo therapy and one day is with husband). Pt is not suicidal today and has not had any suicidal attempts or plans in the past 2 weeks. Of note, her GYN who tested her for STDs did give her a prescription for xanax 0.5mg  tablets to use as needed. Note she is typically having to use one in the morning and one at night time to take the edge off.   Review of Systems  Constitutional: Positive for activity change. Negative for chills, diaphoresis and fever.  HENT: Negative for congestion.   Respiratory: Negative for cough and shortness of breath.   Cardiovascular: Negative for chest pain and palpitations.  Gastrointestinal: Negative for abdominal pain, constipation, diarrhea, nausea and vomiting.    Patient Active Problem List   Diagnosis Date Noted  . GERD (gastroesophageal reflux disease) 12/25/2014  . Obesity 12/25/2014  . Mild persistent asthma in adult without complication 12/25/2014  . Family history of coronary artery disease 12/25/2014  . Allergic rhinitis due to pollen 12/25/2014  . Family history of premature CAD 12/25/2014    Current Outpatient Prescriptions on File Prior to Visit  Medication Sig Dispense Refill  . Ipratropium-Albuterol (COMBIVENT RESPIMAT) 20-100 MCG/ACT AERS respimat Inhale 1 puff into the lungs every 6 (six) hours as needed for wheezing. 3 Inhaler 1  . montelukast (SINGULAIR) 10 MG tablet TAKE 1 TABLET (10 MG  TOTAL) BY MOUTH DAILY. 30 tablet 6   No current facility-administered medications on file prior to visit.     No Known Allergies   Objective:  BP (!) 130/96 (BP Location: Left Arm, Cuff Size: Normal)   Pulse 95   Temp 98.7 F (37.1 C) (Oral)   Resp 17   Ht 5' 4.5" (1.638 m)   Wt 192 lb (87.1 kg)   SpO2 100%   BMI 32.45 kg/m   Physical Exam  Constitutional: She is oriented to person, place, and time. She appears distressed.  HENT:  Head: Normocephalic and atraumatic.  Eyes: Conjunctivae are normal.  Neck: Normal range of motion.  Cardiovascular: Normal rate, regular rhythm and normal heart sounds.   Pulmonary/Chest: Effort normal.  Neurological: She is alert and oriented to person, place, and time. Gait normal.  Skin: Skin is warm and dry.  Psychiatric: Affect normal. She exhibits a depressed mood. She expresses no suicidal plans.  Crying when discussing HPI  Vitals reviewed.    Depression screen [facility] 2/9 08/20/2016 08/20/2016 05/02/2016 03/18/2016  Decreased Interest - 1 0 0  Down, Depressed, Hopeless - 3 0 0  PHQ - 2 Score - 4 0 0  Altered sleeping - 3 - -  Tired, decreased energy - 3 - -  Change in appetite - 0 - -  Feeling bad or failure about yourself  - 3 - -  Trouble concentrating - 3 - -  Moving slowly or fidgety/restless - 0 - -  Suicidal thoughts 1 2 - -  PHQ-9 Score - 18 - -  Difficult doing work/chores - Extremely dIfficult - -    BP Readings from Last 3 Encounters:  08/20/16 (!) 130/96  05/02/16 100/76  03/18/16 130/88     Assessment and Plan :  1. Situational depression -Pt instructed to start taking lexapro 5mg  daily x 1 week. Increase to 10mg  daily after one week. Follow up in 2 weeks for reevaluation. Instructed that if she develops any suicidal thoughts or plans while on this medication call 911 immediately. Continue going to therapy with pastor. I have also provided pt with contact information for local therapists in case she needs further  counseling.  - escitalopram (LEXAPRO) 10 MG tablet; Take 1/2 tablet daily x 1 week. Increase to 1 tablet daily x 1 week.  Dispense: 30 tablet; Refill: 0  2. Elevated blood pressure reading -Likely situational, as pt is very upset in office today. Will monitor at next visit in 2 weeks. Instructed to take bp while outside of office over the next two weeks, goal is <140/90. Instructed to seek care immediately if she develops any symptoms such as chest pain, visual disturbance, or severe headache.    A total of 25 minutes was spent in the room with the patient, greater than 50% of which was in treatment options for her situational depression.   Benjiman CoreBrittany Severiano Utsey PA-C  [facility] 08/20/2016 5:26 PM

## 2016-09-03 ENCOUNTER — Encounter: Payer: Self-pay | Admitting: Physician Assistant

## 2016-09-03 ENCOUNTER — Ambulatory Visit (INDEPENDENT_AMBULATORY_CARE_PROVIDER_SITE_OTHER): Payer: BC Managed Care – PPO | Admitting: Physician Assistant

## 2016-09-03 VITALS — BP 142/88 | HR 77 | Temp 99.1°F | Resp 18 | Ht 64.5 in | Wt 190.4 lb

## 2016-09-03 DIAGNOSIS — R030 Elevated blood-pressure reading, without diagnosis of hypertension: Secondary | ICD-10-CM

## 2016-09-03 DIAGNOSIS — G479 Sleep disorder, unspecified: Secondary | ICD-10-CM | POA: Diagnosis not present

## 2016-09-03 DIAGNOSIS — F4321 Adjustment disorder with depressed mood: Secondary | ICD-10-CM | POA: Diagnosis not present

## 2016-09-03 MED ORDER — HYDROXYZINE HCL 50 MG PO TABS: ORAL_TABLET | ORAL | 0 refills | Status: DC

## 2016-09-03 MED ORDER — ESCITALOPRAM OXALATE 10 MG PO TABS: 10.0000 mg | ORAL_TABLET | Freq: Every day | ORAL | 0 refills | Status: DC

## 2016-09-03 NOTE — Progress Notes (Signed)
MRN: [id number] DOB: 05-11-1971  Subjective:   Wendy Shannon is a 46 y.o. female presenting for follow up on depression. Pt initially seen by me on 08/21/15 for situational depression after she found out her husband of 25 years was having an affair for the past 5 years. Refer to previous note for further detail. During this visit, pt was started on lexapro 5mg  and instructed to titrate up to 10mg  daily.   Since most recent visit, pt states she is doing much better. States she can definitely notice a difference with taking the 10mg  of lexapro daily. She did have some nausea and GI upset when she started the 5mg  but notes that has resolved completley after one week and has had no issues taking the 10mg  daily. She is less tearful during the day and is having less panic attacks. She denies any suicidal ideation or thoughts. She is still attending counseling twice a week.   Pt would like to discuss sleep issues she has been having since she found out the information about her husband. States since this occurred, she has been having difficulty staying asleep throughout the night. Notes she will go to bed around 9:30pm and wake up around midnight and then every hour after this. She has occasionally taken 1/2-1 tablet of xanax 0.5mg  to use for sleep, which helps,  but notes she does not want to become dependent on this. She has also tried melatonin with no relief.    Wendy Shannon has a current medication list which includes the following prescription(s): alprazolam, betamethasone valerate, escitalopram, ipratropium-albuterol, and montelukast. Also has No Known Allergies.  Wendy Shannon  has a past medical history of Asthma and History of chicken pox. Also  has a past surgical history that includes Tonsillectomy; Breast surgery (Bilateral, 2007); and Tonsillectomy and adenoidectomy (1984).   Objective:   Vitals: BP (!) 142/88   Pulse 77   Temp 99.1 F (37.3 C) (Oral)   Resp 18   Ht 5' 4.5" (1.638 m)   Wt  190 lb 6.4 oz (86.4 kg)   SpO2 97%   BMI 32.18 kg/m   Physical Exam  Constitutional: She is oriented to person, place, and time. She appears well-developed and well-nourished.  HENT:  Head: Normocephalic and atraumatic.  Eyes: Conjunctivae are normal.  Neck: Normal range of motion.  Pulmonary/Chest: Effort normal.  Neurological: She is alert and oriented to person, place, and time.  Skin: Skin is warm and dry.  Psychiatric: She has a normal mood and affect.  Vitals reviewed.   BP Readings from Last 3 Encounters:  09/03/16 (!) 142/88  08/20/16 (!) 130/96  05/02/16 100/76    No results found for this or any previous visit (from the past 24 hour(s)).  Assessment and Plan :  1. Situational depression Pt appears much improved since initial visit 2 weeks ago, will continue lexapro 10mg  daily. Return in 4 weeks for follow up.  - escitalopram (LEXAPRO) 10 MG tablet; Take 1 tablet (10 mg total) by mouth daily.  Dispense: 60 tablet; Refill: 0  2. Sleep disturbance Discussed sleep hygiene techniques. Given Rx for hydroxyzine to taper up to a max of 100mg  a night. Will reevaluate this at follow up visit.  - hydrOXYzine (ATARAX/VISTARIL) 50 MG tablet; Take 1/2-2 tablets as needed for sleep.  Dispense: 90 tablet; Refill: 0  3. Elevated blood pressure reading -Pt is asymptomatic. Instructed to check bp while out of office. If it is remaining elevated >140/90, consider initiating medication at  next visit in 4 weeks.   Benjiman Core, PA-C  [facility] 09/03/2016 4:07 PM

## 2016-09-03 NOTE — Patient Instructions (Addendum)
Continue taking lexapro 10mg  daily. Follow up in 4 weeks for reevaluation.   Use 1/2-2 tablets of hydroxyzine at night for sleep disturbance. Make sure you avoid cell phones in the middle of the night when you do wake up and try getting up from the bed when you do wake up.   Continue staying strong and going to therapy.  YOU ARE AWESOME AND BEAUTIFUL!   IF you received an x-ray today, you will receive an [facility]. Please contact [facility] at [phone] with questions or concerns regarding your invoice.   IF you received labwork today, you will receive an invoice from [facility]. Please contact [facility] at [phone] with questions or concerns regarding your invoice.   Our billing staff will not be able to assist you with questions regarding bills from these companies.  You will be contacted with the lab results as soon as they are available. The fastest way to get your results is to activate your My Chart account. Instructions are located on the last page of this paperwork. If you have not heard from us regarding the results in 2 weeks, please contact this office.

## 2016-09-16 ENCOUNTER — Ambulatory Visit (INDEPENDENT_AMBULATORY_CARE_PROVIDER_SITE_OTHER): Payer: BC Managed Care – PPO | Admitting: Physician Assistant

## 2016-09-16 VITALS — BP 142/96 | HR 75 | Temp 99.2°F | Resp 18 | Ht 64.5 in | Wt 191.0 lb

## 2016-09-16 DIAGNOSIS — I10 Essential (primary) hypertension: Secondary | ICD-10-CM

## 2016-09-16 LAB — POCT URINALYSIS DIP (MANUAL ENTRY)
BILIRUBIN UA: NEGATIVE
Bilirubin, UA: NEGATIVE
Glucose, UA: NEGATIVE
Leukocytes, UA: NEGATIVE
Nitrite, UA: NEGATIVE
PROTEIN UA: NEGATIVE
RBC UA: NEGATIVE
Spec Grav, UA: 1.01
UROBILINOGEN UA: 0.2
pH, UA: 7

## 2016-09-16 MED ORDER — LISINOPRIL 10 MG PO TABS: 10.0000 mg | ORAL_TABLET | Freq: Every day | ORAL | 0 refills | Status: DC

## 2016-09-16 NOTE — Patient Instructions (Addendum)
I will contact you with your lab results in the next few days. I would like you to start lisinopril 10mg  daily for high blood pressure. Please check your bp while out of the office over the next couple of weeks. Your goal is <140/90 and >100/60. If you are consistently at or above 160, I want you to come back sooner than your next scheduled appointment. If you are running to low and having symptoms of hypotension, fatigue, lightheadedness, blurry vision, lack of concentration, call me and let me know.   We may want to consider changing your sleep medication at the next visit to help with sleep. Keep doing what you are doing to maintain your health, such as exercising, eating a good diet, and going to therapy. If you ever start to have exertional chest pain, shortness of breath, blurred vision, or severe headache that will not resolve seek care immediately here or at the ED.    I will see you in couple of weeks, Thank you for letting me participate in your health and well being.  Hypertension Hypertension is another name for high blood pressure. High blood pressure forces your heart to work harder to pump blood. This can cause problems over time. There are two numbers in a blood pressure reading. There is a top number (systolic) over a bottom number (diastolic). It is best to have a blood pressure below 120/80. Healthy choices can help lower your blood pressure. You may need medicine to help lower your blood pressure if:  Your blood pressure cannot be lowered with healthy choices.  Your blood pressure is higher than 130/80. Follow these instructions at home: Eating and drinking   If directed, follow the DASH eating plan. This diet includes:  Filling half of your plate at each meal with fruits and vegetables.  Filling one quarter of your plate at each meal with whole grains. Whole grains include whole wheat pasta, brown rice, and whole grain bread.  Eating or drinking low-fat dairy products,  such as skim milk or low-fat yogurt.  Filling one quarter of your plate at each meal with low-fat (lean) proteins. Low-fat proteins include fish, skinless chicken, eggs, beans, and tofu.  Avoiding fatty meat, cured and processed meat, or chicken with skin.  Avoiding premade or processed food.  Eat less than 1,500 mg of salt (sodium) a day.  Limit alcohol use to no more than 1 drink a day for nonpregnant women and 2 drinks a day for men. One drink equals 12 oz of beer, 5 oz of wine, or 1 oz of hard liquor. Lifestyle   Work with your doctor to stay at a healthy weight or to lose weight. Ask your doctor what the best weight is for you.  Get at least 30 minutes of exercise that causes your heart to beat faster (aerobic exercise) most days of the week. This may include walking, swimming, or biking.  Get at least 30 minutes of exercise that strengthens your muscles (resistance exercise) at least 3 days a week. This may include lifting weights or pilates.  Do not use any products that contain nicotine or tobacco. This includes cigarettes and e-cigarettes. If you need help quitting, ask your doctor.  Check your blood pressure at home as told by your doctor.  Keep all follow-up visits as told by your doctor. This is important. Medicines   Take over-the-counter and prescription medicines only as told by your doctor. Follow directions carefully.  Do not skip doses of blood pressure  medicine. The medicine does not work as well if you skip doses. Skipping doses also puts you at risk for problems.  Ask your doctor about side effects or reactions to medicines that you should watch for. Contact a doctor if:  You think you are having a reaction to the medicine you are taking.  You have headaches that keep coming back (recurring).  You feel dizzy.  You have swelling in your ankles.  You have trouble with your vision. Get help right away if:  You get a very bad headache.  You start to  feel confused.  You feel weak or numb.  You feel faint.  You get very bad pain in your:  Chest.  Belly (abdomen).  You throw up (vomit) more than once.  You have trouble breathing. Summary  Hypertension is another name for high blood pressure.  Making healthy choices can help lower blood pressure. If your blood pressure cannot be controlled with healthy choices, you may need to take medicine. This information is not intended to replace advice given to you by your health care provider. Make sure you discuss any questions you have with your health care provider. Document Released: 12/17/2007 Document Revised: 05/28/2016 Document Reviewed: 05/28/2016 Elsevier Interactive Patient Education  2017 Else[employer] Inc.   DASH Eating Plan DASH stands for "Dietary Approaches to Stop Hypertension." The DASH eating plan is a healthy eating plan that has been shown to reduce high blood pressure (hypertension). It may also reduce your risk for type 2 diabetes, heart disease, and stroke. The DASH eating plan may also help with weight loss. What are tips for following this plan? General guidelines   Avoid eating more than 2,300 mg (milligrams) of salt (sodium) a day. If you have hypertension, you may need to reduce your sodium intake to 1,500 mg a day.  Limit alcohol intake to no more than 1 drink a day for nonpregnant women and 2 drinks a day for men. One drink equals 12 oz of beer, 5 oz of wine, or 1 oz of hard liquor.  Work with your health care provider to maintain a healthy body weight or to lose weight. Ask what an ideal weight is for you.  Get at least 30 minutes of exercise that causes your heart to beat faster (aerobic exercise) most days of the week. Activities may include walking, swimming, or biking.  Work with your health care provider or diet and nutrition specialist (dietitian) to adjust your eating plan to your individual calorie needs. Reading food labels   Check food labels for  the amount of sodium per serving. Choose foods with less than 5 percent of the Daily Value of sodium. Generally, foods with less than 300 mg of sodium per serving fit into this eating plan.  To find whole grains, look for the word "whole" as the first word in the ingredient list. Shopping   Buy products labeled as "low-sodium" or "no salt added."  Buy fresh foods. Avoid canned foods and premade or frozen meals. Cooking   Avoid adding salt when cooking. Use salt-free seasonings or herbs instead of table salt or sea salt. Check with your health care provider or pharmacist before using salt substitutes.  Do not fry foods. Cook foods using healthy methods such as baking, boiling, grilling, and broiling instead.  Cook with heart-healthy oils, such as olive, canola, soybean, or sunflower oil. Meal planning    Eat a balanced diet that includes:  5 or more servings of fruits and vegetables  each day. At each meal, try to fill half of your plate with fruits and vegetables.  Up to 6-8 servings of whole grains each day.  Less than 6 oz of lean meat, poultry, or fish each day. A 3-oz serving of meat is about the same size as a deck of cards. One egg equals 1 oz.  2 servings of low-fat dairy each day.  A serving of nuts, seeds, or beans 5 times each week.  Heart-healthy fats. Healthy fats called Omega-3 fatty acids are found in foods such as flaxseeds and coldwater fish, like sardines, salmon, and mackerel.  Limit how much you eat of the following:  Canned or prepackaged foods.  Food that is high in trans fat, such as fried foods.  Food that is high in saturated fat, such as fatty meat.  Sweets, desserts, sugary drinks, and other foods with added sugar.  Full-fat dairy products.  Do not salt foods before eating.  Try to eat at least 2 vegetarian meals each week.  Eat more home-cooked food and less restaurant, buffet, and fast food.  When eating at a restaurant, ask that your food  be prepared with less salt or no salt, if possible. What foods are recommended? The items listed may not be a complete list. Talk with your dietitian about what dietary choices are best for you. Grains  Whole-grain or whole-wheat bread. Whole-grain or whole-wheat pasta. Brown rice. Orpah Cobb. Bulgur. Whole-grain and low-sodium cereals. Pita bread. Low-fat, low-sodium crackers. Whole-wheat flour tortillas. Vegetables  Fresh or frozen vegetables (raw, steamed, roasted, or grilled). Low-sodium or reduced-sodium tomato and vegetable juice. Low-sodium or reduced-sodium tomato sauce and tomato paste. Low-sodium or reduced-sodium canned vegetables. Fruits  All fresh, dried, or frozen fruit. Canned fruit in natural juice (without added sugar). Meat and other protein foods  Skinless chicken or [place]. Ground chicken or [place]. Pork with fat trimmed off. Fish and seafood. Egg whites. Dried beans, peas, or lentils. Unsalted nuts, nut butters, and seeds. Unsalted canned beans. Lean cuts of beef with fat trimmed off. Low-sodium, lean deli meat. Dairy  Low-fat (1%) or fat-free (skim) milk. Fat-free, low-fat, or reduced-fat cheeses. Nonfat, low-sodium ricotta or cottage cheese. Low-fat or nonfat yogurt. Low-fat, low-sodium cheese. Fats and oils  Soft margarine without trans fats. Vegetable oil. Low-fat, reduced-fat, or light mayonnaise and salad dressings (reduced-sodium). Canola, safflower, olive, soybean, and sunflower oils. Avocado. Seasoning and other foods  Herbs. Spices. Seasoning mixes without salt. Unsalted popcorn and pretzels. Fat-free sweets. What foods are not recommended? The items listed may not be a complete list. Talk with your dietitian about what dietary choices are best for you. Grains  Baked goods made with fat, such as croissants, muffins, or some breads. Dry pasta or rice meal packs. Vegetables  Creamed or fried vegetables. Vegetables in a cheese sauce. Regular canned vegetables  (not low-sodium or reduced-sodium). Regular canned tomato sauce and paste (not low-sodium or reduced-sodium). Regular tomato and vegetable juice (not low-sodium or reduced-sodium). Rosita Fire. Olives. Fruits  Canned fruit in a light or heavy syrup. Fried fruit. Fruit in cream or butter sauce. Meat and other protein foods  Fatty cuts of meat. Ribs. Fried meat. Tomasa Blase. Sausage. Bologna and other processed lunch meats. Salami. Fatback. Hotdogs. Bratwurst. Salted nuts and seeds. Canned beans with added salt. Canned or smoked fish. Whole eggs or egg yolks. Chicken or [place] with skin. Dairy  Whole or 2% milk, cream, and half-and-half. Whole or full-fat cream cheese. Whole-fat or sweetened yogurt. Full-fat cheese. Nondairy  creamers. Whipped toppings. Processed cheese and cheese spreads. Fats and oils  Butter. Stick margarine. Lard. Shortening. Ghee. Bacon fat. Tropical oils, such as coconut, palm kernel, or palm oil. Seasoning and other foods  Salted popcorn and pretzels. Onion salt, garlic salt, seasoned salt, table salt, and sea salt. Worcestershire sauce. Tartar sauce. Barbecue sauce. Teriyaki sauce. Soy sauce, including reduced-sodium. Steak sauce. Canned and packaged gravies. Fish sauce. Oyster sauce. Cocktail sauce. Horseradish that you find on the shelf. Ketchup. Mustard. Meat flavorings and tenderizers. Bouillon cubes. Hot sauce and Tabasco sauce. Premade or packaged marinades. Premade or packaged taco seasonings. Relishes. Regular salad dressings. Where to find more information:  National Heart, Lung, and Blood Institute: PopSteam.is  American Heart Association: www.heart.org Summary  The DASH eating plan is a healthy eating plan that has been shown to reduce high blood pressure (hypertension). It may also reduce your risk for type 2 diabetes, heart disease, and stroke.  With the DASH eating plan, you should limit salt (sodium) intake to 2,300 mg a day. If you have hypertension, you may need  to reduce your sodium intake to 1,500 mg a day.  When on the DASH eating plan, aim to eat more fresh fruits and vegetables, whole grains, lean proteins, low-fat dairy, and heart-healthy fats.  Work with your health care provider or diet and nutrition specialist (dietitian) to adjust your eating plan to your individual calorie needs. This information is not intended to replace advice given to you by your health care provider. Make sure you discuss any questions you have with your health care provider. Document Released: 06/19/2011 Document Revised: 06/23/2016 Document Reviewed: 06/23/2016 Elsevier Interactive Patient Education  2017 [employer].   How to Take Your Blood Pressure You can take your blood pressure at home with a machine. You may need to check your blood pressure at home:  To check if you have high blood pressure (hypertension).  To check your blood pressure over time.  To make sure your blood pressure medicine is working. Supplies needed: You will need a blood pressure machine, or monitor. You can buy one at a drugstore or online. When choosing one:  Choose one with an arm cuff.  Choose one that wraps around your upper arm. Only one finger should fit between your arm and the cuff.  Do not choose one that measures your blood pressure from your wrist or finger. Your doctor can suggest a monitor. How to prepare Avoid these things for 30 minutes before checking your blood pressure:  Drinking caffeine.  Drinking alcohol.  Eating.  Smoking.  Exercising. Five minutes before checking your blood pressure:  Pee.  Sit in a dining chair. Avoid sitting in a soft couch or armchair.  Be quiet. Do not talk. How to take your blood pressure Follow the instructions that came with your machine. If you have a digital blood pressure monitor, these may be the instructions: 1. Sit up straight. 2. Place your feet on the floor. Do not cross your ankles or legs. 3. Rest your  left arm at the level of your heart. You may rest it on a table, desk, or chair. 4. Pull up your shirt sleeve. 5. Wrap the blood pressure cuff around the upper part of your left arm. The cuff should be 1 inch (2.5 cm) above your elbow. It is best to wrap the cuff around bare skin. 6. Fit the cuff snugly around your arm. You should be able to place only one finger between the cuff and  your arm. 7. Put the cord inside the groove of your elbow. 8. Press the power button. 9. Sit quietly while the cuff fills with air and loses air. 10. Write down the numbers on the screen. 11. Wait 2-3 minutes and then repeat steps 1-10. What do the numbers mean? Two numbers make up your blood pressure. The first number is called systolic pressure. The second is called diastolic pressure. An example of a blood pressure reading is "120 over 80" (or 120/80). If you are an adult and do not have a medical condition, use this guide to find out if your blood pressure is normal: Normal   First number: below 120.  Second number: below 80. Elevated   First number: 120-129.  Second number: below 80. Hypertension stage 1   First number: 130-139.  Second number: 80-89. Hypertension stage 2   First number: 140 or above.  Second number: 90 or above. Your blood pressure is above normal even if only the top or bottom number is above normal. Follow these instructions at home:  Check your blood pressure as often as your doctor tells you to.  Take your monitor to your next doctor's appointment. Your doctor will:  Make sure you are using it correctly.  Make sure it is working right.  Make sure you understand what your blood pressure numbers should be.  Tell your doctor if your medicines are causing side effects. Contact a doctor if:  Your blood pressure keeps being high. Get help right away if:  Your first blood pressure number is higher than 180.  Your second blood pressure number is higher than  120. This information is not intended to replace advice given to you by your health care provider. Make sure you discuss any questions you have with your health care provider. Document Released: 06/12/2008 Document Revised: 05/28/2016 Document Reviewed: 12/07/2015 Elsevier Interactive Patient Education  2017 [employer].   IF you received an x-ray today, you will receive an invoice from [facility]. Please contact [facility] at [phone] with questions or concerns regarding your invoice.   IF you received labwork today, you will receive an invoice from [facility]. Please contact [facility] at [phone] with questions or concerns regarding your invoice.   Our billing staff will not be able to assist you with questions regarding bills from these companies.  You will be contacted with the lab results as soon as they are available. The fastest way to get your results is to activate your My Chart account. Instructions are located on the last page of this paperwork. If you have not heard from [place] regarding the results in 2 weeks, please contact this office.

## 2016-09-16 NOTE — Progress Notes (Signed)
MRN: [id number] DOB: 09-18-1970  Subjective:   Wendy Shannon is a 46 y.o. female presenting for follow up on elevated bp readings. She has had a few elevated readings with me in office over the past month. However, she was being treated for situational depression/anxiety and was very stressed in office during those readings. She has also not been sleeping very good during this time. She was fold to follow bp outside the office as she has never had HTN before. Noticied yesterday before zumba that her systolic bp was in the 409W-119J. She checked it again today and it was 150. Reports a dull headache. She is also having intermittent panic attacks but that is associated with her situational anxiety. Denies lightheadedness, dizziness,  double vision, chest pain, shortness of breath, , nausea, vomiting, abdominal pain, hematuria, lower leg swelling.   Denies smoking. Has occasional alcohol use, once glass of wine some nights.  FH of MI in father at age 85. She does not know any furhter hx in her father.   Diet: Consists of a variety of foods like granola, yogurt, eggs, occasional red meat, chicken, vegetables. Exercise: She goes to zumba four times a week.    Wendy Shannon has a current medication list which includes the following prescription(s): alprazolam, betamethasone valerate, escitalopram, hydroxyzine, ipratropium-albuterol, montelukast, and lisinopril. Also has No Known Allergies.  Wendy Shannon  has a past medical history of Asthma and History of chicken pox. Also  has a past surgical history that includes Tonsillectomy; Breast surgery (Bilateral, 2007); and Tonsillectomy and adenoidectomy (1984).   Objective:   Vitals: BP (!) 142/96 (BP Location: Right Arm, Patient Position: Sitting, Cuff Size: Small)   Pulse 75   Temp 99.2 F (37.3 C) (Oral)   Resp 18   Ht 5' 4.5" (1.638 m)   Wt 191 lb (86.6 kg)   SpO2 99%   BMI 32.28 kg/m   Physical Exam  Constitutional: She is oriented to person,  place, and time. She appears well-developed and well-nourished.  HENT:  Head: Normocephalic and atraumatic.  Eyes: Conjunctivae are normal.  Neck: Normal range of motion.  Cardiovascular: Normal rate, regular rhythm, normal heart sounds and intact distal pulses.   Pulmonary/Chest: Effort normal.  Neurological: She is alert and oriented to person, place, and time. No cranial nerve deficit.  Skin: Skin is warm and dry.  Psychiatric: She has a normal mood and affect.  Vitals reviewed.   BP Readings from Last 3 Encounters:  09/16/16 (!) 142/96  09/03/16 (!) 142/88  08/20/16 (!) 130/96    Results for orders placed or performed in visit on 09/16/16 (from the past 24 hour(s))  POCT urinalysis dipstick     Status: Abnormal   Collection Time: 09/16/16  9:57 AM  Result Value Ref Range   Color, UA yellow yellow   Clarity, UA cloudy (A) clear   Glucose, UA negative negative   Bilirubin, UA negative negative   Ketones, POC UA negative negative   Spec Grav, UA 1.010    Blood, UA negative negative   pH, UA 7.0    Protein Ur, POC negative negative   Urobilinogen, UA 0.2    Nitrite, UA Negative Negative   Leukocytes, UA Negative Negative   EKG shows NSR at 65 bpm. There are no acute ST or T wave changes.   Assessment and Plan :  1. Essential hypertension -Likely due situational stress and sleep disturbance. However due to the consistent elevations in her bp and FH of MI  in father at age 72, it is likely that she will benefit from medication initiation. Pt instructed to check bp outside of office and her goal is <140/90 and >100/60. If she is consistently outside of this range to contact our office immediately. Pt is following up for anxiety/depression in 2 weeks. Will recheck bp at this visit. May consider changing sleep medication at this next visit if she is still not sleeping with well with current tx plan. Pt instructed that if she develops any exertional chest pain, shortness of breath,  blurred vision, or severe headache that will not resolve seek care immediately here or at the ED.  - CBC with Differential/Platelet - CMP14+EGFR - Lipid panel - TSH - POCT urinalysis dipstick - EKG 12-Lead - lisinopril (PRINIVIL,ZESTRIL) 10 MG tablet; Take 1 tablet (10 mg total) by mouth daily.  Dispense: 30 tablet; Refill: 0   Tenna Delaine, PA-C  [facility] 09/16/2016 2:34 PM

## 2016-09-17 LAB — CMP14+EGFR
A/G RATIO: 1.6 (ref 1.2–2.2)
ALK PHOS: 61 IU/L (ref 39–117)
ALT: 16 IU/L (ref 0–32)
AST: 14 IU/L (ref 0–40)
Albumin: 4 g/dL (ref 3.5–5.5)
BUN/Creatinine Ratio: 14 (ref 9–23)
BUN: 11 mg/dL (ref 6–24)
Bilirubin Total: 0.4 mg/dL (ref 0.0–1.2)
CO2: 23 mmol/L (ref 18–29)
Calcium: 9.2 mg/dL (ref 8.7–10.2)
Chloride: 102 mmol/L (ref 96–106)
Creatinine, Ser: 0.81 mg/dL (ref 0.57–1.00)
GFR calc Af Amer: 101 mL/min/{1.73_m2} (ref >59)
GFR, EST NON AFRICAN AMERICAN: 88 mL/min/{1.73_m2} (ref >59)
GLOBULIN, TOTAL: 2.5 g/dL (ref 1.5–4.5)
Glucose: 90 mg/dL (ref 65–99)
POTASSIUM: 4.1 mmol/L (ref 3.5–5.2)
SODIUM: 141 mmol/L (ref 134–144)
Total Protein: 6.5 g/dL (ref 6.0–8.5)

## 2016-09-17 LAB — CBC WITH DIFFERENTIAL/PLATELET
BASOS: 1 %
Basophils Absolute: 0.1 10*3/uL (ref 0.0–0.2)
EOS (ABSOLUTE): 0.3 10*3/uL (ref 0.0–0.4)
EOS: 5 %
HEMATOCRIT: 39.4 % (ref 34.0–46.6)
Hemoglobin: 13.2 g/dL (ref 11.1–15.9)
Immature Grans (Abs): 0 10*3/uL (ref 0.0–0.1)
Immature Granulocytes: 0 %
Lymphocytes Absolute: 1.7 10*3/uL (ref 0.7–3.1)
Lymphs: 25 %
MCH: 27.9 pg (ref 26.6–33.0)
MCHC: 33.5 g/dL (ref 31.5–35.7)
MCV: 83 fL (ref 79–97)
MONOS ABS: 0.4 10*3/uL (ref 0.1–0.9)
Monocytes: 6 %
NEUTROS ABS: 4.4 10*3/uL (ref 1.4–7.0)
Neutrophils: 63 %
Platelets: 316 10*3/uL (ref 150–379)
RBC: 4.73 x10E6/uL (ref 3.77–5.28)
RDW: 13.7 % (ref 12.3–15.4)
WBC: 6.9 10*3/uL (ref 3.4–10.8)

## 2016-09-17 LAB — LIPID PANEL
CHOL/HDL RATIO: 3.3 ratio (ref 0.0–4.4)
CHOLESTEROL TOTAL: 170 mg/dL (ref 100–199)
HDL: 51 mg/dL (ref >39)
LDL Calculated: 106 mg/dL — ABNORMAL HIGH (ref 0–99)
Triglycerides: 67 mg/dL (ref 0–149)
VLDL Cholesterol Cal: 13 mg/dL (ref 5–40)

## 2016-09-17 LAB — TSH: TSH: 3.33 u[IU]/mL (ref 0.450–4.500)

## 2016-09-29 ENCOUNTER — Other Ambulatory Visit: Payer: Self-pay | Admitting: Physician Assistant

## 2016-09-29 DIAGNOSIS — G479 Sleep disorder, unspecified: Secondary | ICD-10-CM

## 2016-10-01 ENCOUNTER — Ambulatory Visit: Payer: BC Managed Care – PPO | Admitting: Physician Assistant

## 2016-10-03 ENCOUNTER — Ambulatory Visit (INDEPENDENT_AMBULATORY_CARE_PROVIDER_SITE_OTHER): Payer: BC Managed Care – PPO | Admitting: Physician Assistant

## 2016-10-03 ENCOUNTER — Encounter: Payer: Self-pay | Admitting: Physician Assistant

## 2016-10-03 ENCOUNTER — Other Ambulatory Visit: Payer: Self-pay | Admitting: Physician Assistant

## 2016-10-03 ENCOUNTER — Ambulatory Visit: Payer: BC Managed Care – PPO

## 2016-10-03 VITALS — BP 128/86 | HR 80 | Temp 98.7°F | Resp 17 | Ht 64.5 in | Wt 188.0 lb

## 2016-10-03 DIAGNOSIS — I10 Essential (primary) hypertension: Secondary | ICD-10-CM | POA: Diagnosis not present

## 2016-10-03 DIAGNOSIS — F4321 Adjustment disorder with depressed mood: Secondary | ICD-10-CM

## 2016-10-03 DIAGNOSIS — G479 Sleep disorder, unspecified: Secondary | ICD-10-CM

## 2016-10-03 DIAGNOSIS — G4700 Insomnia, unspecified: Secondary | ICD-10-CM

## 2016-10-03 MED ORDER — ESCITALOPRAM OXALATE 10 MG PO TABS
10.0000 mg | ORAL_TABLET | Freq: Every day | ORAL | 1 refills | Status: DC
Start: 2016-10-03 — End: 2017-05-12

## 2016-10-03 MED ORDER — SUVOREXANT 10 MG PO TABS: 10.0000 mg | ORAL_TABLET | Freq: Every day | ORAL | 0 refills | Status: DC

## 2016-10-03 MED ORDER — LISINOPRIL 10 MG PO TABS: 10.0000 mg | ORAL_TABLET | Freq: Every day | ORAL | 1 refills | Status: DC

## 2016-10-03 NOTE — Progress Notes (Signed)
MRN: [id number] DOB: [birth year]  Subjective:   Wendy Shannon is a 46 y.o. female presenting for follow up on hypertension, depression, and sleep.   HTN: Diagnosis was made on 09/16/16.  Currently managed with lisinopril 10mg . Patient is checking blood pressure at home, range is 120-140s systolic. Denies lightheadedness, dizziness, chronic headache, double vision, chest pain, shortness of breath, heart racing, palpitations, nausea, vomiting, abdominal pain, hematuria, lower leg swelling.  Situational Depression: Diagnosis was made on 08/21/2015. Currently managed with lexapro 10mg . States this is the best she has felt since her diagnosis was made. She is no longer having panic attacks, nightmares, or crying spells. She has barely had to take xanax over the past 3 weeks. Notes she is handling stressful situations very well. She is continuing therapy. Notes since the last visit, she did ask her husband to move out. She handled this very well and notes they are still friends and are still attending therapy together but she just needs some distance right now.   Sleep: Pt has been having issues with sleeping for the past 1.5 months. She is having difficulty staying asleep throughout the night. She has no issues falling asleep. Will go to bed around 10-11pm and consistently wake up at 3am and not be able to fall asleep unitl 5am then has to wake back up at 6am. A month ago, I prescribed hydroxyzine for help with sleep. States she has been taking it daily but has not really noticed a difference. Has good sleep hygiene.   Wendy Shannon has a current medication list which includes the following prescription(s): alprazolam, betamethasone valerate, escitalopram, hydroxyzine, ipratropium-albuterol, lisinopril, and montelukast. Also has No Known Allergies.  Wendy Shannon  has a past medical history of Asthma and History of chicken pox. Also  has a past surgical history that includes Tonsillectomy; Breast surgery  (Bilateral, 2007); and Tonsillectomy and adenoidectomy (1984).   Objective:   Vitals: BP 128/86 (BP Location: Right Arm, Patient Position: Sitting, Cuff Size: Normal)   Pulse 80   Temp 98.7 F (37.1 C) (Oral)   Resp 17   Ht 5' 4.5" (1.638 m)   Wt 188 lb (85.3 kg)   SpO2 98%   BMI 31.77 kg/m   Physical Exam  Constitutional: She is oriented to person, place, and time. She appears well-developed and well-nourished.  HENT:  Head: Normocephalic and atraumatic.  Eyes: Conjunctivae are normal.  Neck: Normal range of motion.  Cardiovascular: Normal rate, regular rhythm, normal heart sounds and intact distal pulses.   Pulmonary/Chest: Effort normal and breath sounds normal.  Musculoskeletal:       Right lower leg: She exhibits swelling.       Left lower leg: She exhibits swelling.  Neurological: She is alert and oriented to person, place, and time.  Skin: Skin is warm and dry.  Psychiatric: She has a normal mood and affect.  Vitals reviewed.  No results found for this or any previous visit (from the past 24 hour(s)).  Assessment and Plan :  1. Essential hypertension Well controlled in office today. Plan for patient to follow up in 3 months with me. Will repeat CMP at this time.  - lisinopril (PRINIVIL,ZESTRIL) 10 MG tablet; Take 1 tablet (10 mg total) by mouth daily.  Dispense: 90 tablet; Refill: 1  2. Situational depression Continue lexapro 10mg . - escitalopram (LEXAPRO) 10 MG tablet; Take 1 tablet (10 mg total) by mouth daily.  Dispense: 90 tablet; Refill: 1  3. Insomnia, unspecified type -Discontinue hydroxyzine. Pt  appears to be a perfect candidate for belsomra as it is indicated for patients who have trouble staying asleep, not falling asleep. I have discussed the medication with patient and she would like to give it a try. Pt instructed to contact me in a few weeks to inform me on how medication is working for her. She will need to pick up future refills for this medication  in office as it is a controlled substance and cannot be e-prescribed. She understands and agrees to contact me in a few weeks.  - Suvorexant (BELSOMRA) 10 MG TABS; Take 10 mg by mouth at bedtime. 30 minutes before bed. Do not take if you cannot get 7 hours of sleep.  Dispense: 30 tablet; Refill: 0  Benjiman CoreBrittany Binyomin Brann, PA-C  [facility] 10/03/2016 3:56 PM

## 2016-10-03 NOTE — Patient Instructions (Addendum)
For high blood pressure, continue lisinopril 10mg ! It appears to be working great. Follow up in three months and we will do your blood work at this visit.   For anxiety/depression, continue lexapro 10mg  daily and keep doing your therapy. You are doing so awesome, I am very proud of you!  For sleep, stop hydroxyzine. Start belsomra 10mg  tablets 30 minutes before bedtime. Please let me know in a few weeks how this is working for you and if you want continued prescriptions. You will have to pick them up in the office if you do.   Thank you for coming in today, it was a pleasure seeing you. Please call me if you ever need anything.   Suvorexant oral tablets What is this medicine? SUVOREXANT (su-vor-EX-ant) is used to treat insomnia. This medicine helps you to fall asleep and sleep through the night. This medicine may be used for other purposes; ask your health care provider or pharmacist if you have questions. COMMON BRAND NAME(S): Belsomra What should I tell my health care provider before I take this medicine? They need to know if you have any of these conditions: -depression -history of a drug or alcohol abuse problem -history of daytime sleepiness -history of sudden onset of muscle weakness (cataplexy) -liver disease -lung or breathing disease -narcolepsy -suicidal thoughts, plans, or attempt; a previous suicide attempt by you or a family member -an unusual or allergic reaction to suvorexant, other medicines, foods, dyes, or preservatives -pregnant or trying to get pregnant -breast-feeding How should I use this medicine? Take this medicine by mouth within 30 minutes of going to bed. Do not take it unless you are able to stay in bed a full night before you must be active again. Follow the directions on the prescription label. For best results, it is better to take this medicine on an empty stomach. Do not take your medicine more often than directed. Do not stop taking this medicine on your  own. Always follow your doctor or health care professional's advice. A special MedGuide will be given to you by the pharmacist with each prescription and refill. Be sure to read this information carefully each time. Talk to your pediatrician regarding the use of this medicine in children. Special care may be needed. Overdosage: If you think you have taken too much of this medicine contact a poison control center or emergency room at once. NOTE: This medicine is only for you. Do not share this medicine with others. What if I miss a dose? This medicine should only be taken immediately before going to sleep. Do not take double or extra doses. What may interact with this medicine? -alcohol -antiviral medicines for HIV or AIDS -aprepitant -carbamazepine -certain antibiotics like ciprofloxacin, clarithromycin, erythromycin, telithromycin -certain medicines for depression or psychotic disturbances -certain medicines for fungal infections like ketoconazole, posaconazole, fluconazole, or itraconazole -conivaptan -digoxin -diltiazem -grapefruit juice -imatinib -medicines for anxiety or sleep -phenytoin -rifampin -verapamil This list may not describe all possible interactions. Give your health care provider a list of all the medicines, herbs, non-prescription drugs, or dietary supplements you use. Also tell them if you smoke, drink alcohol, or use illegal drugs. Some items may interact with your medicine. What should I watch for while using this medicine? Visit your doctor or health care professional for regular checks on your progress. Keep a regular sleep schedule by going to bed at about the same time each night. Avoid caffeine-containing drinks in the evening hours. When sleep medicines are used every night  for more than a few weeks, they may stop working. Do not increase the dose on your own. Talk to your doctor if your insomnia worsens or is not better within 7 to 10 days. After taking this  medicine for sleep, you may get up out of bed while not being fully awake and do an activity that you do not know you are doing. The next morning, you may have no memory of the event. Activities such as driving a car ("sleep-driving"), making and eating food, talking on the phone, sexual activity, and sleep-walking have been reported. Call your doctor right away if you find out you have done any of these activities. Do not take this medicine if you have used alcohol that evening or before bed or taken another medicine for sleep, since your risk of doing these sleep-related activities will be increased. Do not take this medicine unless you are able to stay in bed for a full night (7 to 8 hours) and do not drive or perform other activities requiring full alertness within 8 hours of a dose. Do not drive, use machinery, or do anything that needs mental alertness the day after you take the 20 mg dose of this medicine. The use of lower doses (10 mg) also has the potential to cause driving impairment the next day. You may have a decrease in mental alertness the day after use, even if you feel that you are fully awake. Tell your doctor if you will need to perform activities requiring full alertness, such as driving, the next day. Do not stand or sit up quickly after taking this medicine, especially if you are an older patient. This reduces the risk of dizzy or fainting spells. If you or your family notice any changes in your behavior, such as new or worsening depression, thoughts of harming yourself, anxiety, other unusual or disturbing thoughts, or memory loss, call your doctor right away. What side effects may I notice from receiving this medicine? Side effects that you should report to your doctor or health care professional as soon as possible: -allergic reactions like skin rash, itching or hives, swelling of the face, lips, or tongue -confusion -depressed mood -feeling faint or lightheaded,  falls -hallucinations -inability to move or speak for up to several minutes while you are going to sleep or waking up -memory loss -periods of leg weakness lasting from seconds to a few minutes -problems with balance, speaking, walking -restlessness, excitability, or feelings of agitation -unusual activities while asleep like driving, eating, making phone calls Side effects that usually do not require medical attention (report to your doctor or health care professional if they continue or are bothersome): -abnormal dreams -daytime drowsiness -diarrhea -dizziness -headache This list may not describe all possible side effects. Call your doctor for medical advice about side effects. You may report side effects to FDA at 1-800-FDA-1088. Where should I keep my medicine? Keep out of the reach of children. This medicine can be abused. Keep your medicine in a safe place to protect it from theft. Do not share this medicine with anyone. Selling or giving away this medicine is dangerous and against the law. Store at room temperature between 15 and 30 degrees C (59 and 86 degrees F). Throw away any unused medicine after the expiration date. NOTE: This sheet is a summary. It may not cover all possible information. If you have questions about this medicine, talk to your doctor, pharmacist, or health care provider.  2018 Elsevier/Gold Standard (2015-08-02 11:54:49)  IF you received an x-ray today, you will receive an invoice from [facility]. Please contact [facility] at [phone] with questions or concerns regarding your invoice.   IF you received labwork today, you will receive an invoice from [facility]. Please contact [facility] at [phone] with questions or concerns regarding your invoice.   Our billing staff will not be able to assist you with questions regarding bills from these companies.  You will be contacted with the lab results as soon as they are available. The fastest  way to get your results is to activate your My Chart account. Instructions are located on the last page of this paperwork. If you have not heard from us regarding the results in 2 weeks, please contact this office.

## 2016-10-06 ENCOUNTER — Other Ambulatory Visit: Payer: Self-pay | Admitting: Family Medicine

## 2016-10-16 ENCOUNTER — Telehealth: Payer: Self-pay

## 2016-10-16 NOTE — Telephone Encounter (Signed)
belsomra needs PA Key [id number]

## 2016-10-17 NOTE — Telephone Encounter (Signed)
our information has been submitted to [employer]. To check for an updated outcome later, reopen this PA request from your dashboard.

## 2016-10-18 NOTE — Telephone Encounter (Signed)
Approved 10/17/16-10/18/2019 

## 2016-10-29 ENCOUNTER — Other Ambulatory Visit: Payer: Self-pay | Admitting: Family Medicine

## 2016-11-15 ENCOUNTER — Other Ambulatory Visit: Payer: Self-pay | Admitting: Physician Assistant

## 2016-11-15 DIAGNOSIS — G479 Sleep disorder, unspecified: Secondary | ICD-10-CM

## 2016-11-30 ENCOUNTER — Other Ambulatory Visit: Payer: Self-pay | Admitting: Physician Assistant

## 2016-11-30 DIAGNOSIS — G4700 Insomnia, unspecified: Secondary | ICD-10-CM

## 2016-12-02 NOTE — Telephone Encounter (Signed)
10/03/16 last ov and refill 

## 2016-12-03 NOTE — Telephone Encounter (Signed)
Called to [facility]. 

## 2016-12-14 ENCOUNTER — Other Ambulatory Visit: Payer: Self-pay | Admitting: Family Medicine

## 2016-12-29 ENCOUNTER — Other Ambulatory Visit: Payer: Self-pay | Admitting: Physician Assistant

## 2016-12-29 DIAGNOSIS — G479 Sleep disorder, unspecified: Secondary | ICD-10-CM

## 2017-01-13 ENCOUNTER — Other Ambulatory Visit: Payer: Self-pay | Admitting: Physician Assistant

## 2017-01-13 DIAGNOSIS — G479 Sleep disorder, unspecified: Secondary | ICD-10-CM

## 2017-01-16 MED ORDER — HYDROXYZINE HCL 50 MG PO TABS: ORAL_TABLET | ORAL | 0 refills | Status: DC

## 2017-01-16 NOTE — Telephone Encounter (Signed)
[facility]

## 2017-04-04 ENCOUNTER — Other Ambulatory Visit: Payer: Self-pay | Admitting: Physician Assistant

## 2017-04-04 DIAGNOSIS — I10 Essential (primary) hypertension: Secondary | ICD-10-CM

## 2017-04-20 ENCOUNTER — Ambulatory Visit (INDEPENDENT_AMBULATORY_CARE_PROVIDER_SITE_OTHER): Payer: BC Managed Care – PPO | Admitting: Physician Assistant

## 2017-04-20 ENCOUNTER — Encounter: Payer: Self-pay | Admitting: Physician Assistant

## 2017-04-20 VITALS — BP 132/88 | HR 92 | Temp 98.8°F | Resp 18 | Ht 64.57 in | Wt 210.2 lb

## 2017-04-20 DIAGNOSIS — R197 Diarrhea, unspecified: Secondary | ICD-10-CM

## 2017-04-20 DIAGNOSIS — F329 Major depressive disorder, single episode, unspecified: Secondary | ICD-10-CM

## 2017-04-20 DIAGNOSIS — F419 Anxiety disorder, unspecified: Secondary | ICD-10-CM | POA: Diagnosis not present

## 2017-04-20 DIAGNOSIS — F32A Depression, unspecified: Secondary | ICD-10-CM

## 2017-04-20 DIAGNOSIS — I10 Essential (primary) hypertension: Secondary | ICD-10-CM | POA: Diagnosis not present

## 2017-04-20 DIAGNOSIS — R05 Cough: Secondary | ICD-10-CM | POA: Diagnosis not present

## 2017-04-20 DIAGNOSIS — R059 Cough, unspecified: Secondary | ICD-10-CM

## 2017-04-20 DIAGNOSIS — Z87898 Personal history of other specified conditions: Secondary | ICD-10-CM | POA: Diagnosis not present

## 2017-04-20 MED ORDER — SCOPOLAMINE 1 MG/3DAYS TD PT72: 1.0000 | MEDICATED_PATCH | TRANSDERMAL | 0 refills | Status: DC

## 2017-04-20 MED ORDER — HYDROCHLOROTHIAZIDE 12.5 MG PO CAPS: 12.5000 mg | ORAL_CAPSULE | Freq: Every day | ORAL | 1 refills | Status: DC

## 2017-04-20 MED ORDER — DIPHENOXYLATE-ATROPINE 2.5-0.025 MG PO TABS: 1.0000 | ORAL_TABLET | Freq: Four times a day (QID) | ORAL | 0 refills | Status: DC | PRN

## 2017-04-20 NOTE — Progress Notes (Signed)
Wendy Shannon  MRN: [id number] DOB: 1970/12/16  Subjective:  Wendy Shannon is a 46 y.o. female seen in office today for a chief complaint of multiple complaints.  1) Dry cough: Pt has noticed an irritating dry cough since starting lisinopril in 09/2016. She has been trying to see if it would just get better but it has persisted since she has been on the mediation. Denies sputum production, SOB, DOE, heartburn, and belching. Denies smoking. Has no PMH of COPD or asthma. She typically uses throat lozenges to help but never gets full relief. Of note, she has been monitoring bp outside of office and systolic range is typically in the 130s. No hx of gout.   2) Diarrhea: Pt has noticed diarrhea ever since starting lexapro in 08/2016. She is currently on lexapro  daily. Notes she has about 3 loose stools per day, which has remained pretty consistent since starting this medications. Denies change in diet, recent travel, abdominal pain, nausea, vomiting, hematochezia, and constipation. Has not tried anything for relief. In terms of anxiety and depression, she notes this is the best she has felt since before the incident with her husband. Notes she and her husband are really working hard on their relationship and she is feeling very positive about their progress. Thinks their marriage is going to be stronger than ever after they get through all of this. He has moved back in with her. He is maintaining a good relationship with the kids. She is feeling great at her job. Denies recent panic attacks, episodes of crying, dysphoric mood, suicidal and homicidal thoughts.  3) Motion sickness: Pt is leaving for a cruise next week and will be gone for one full week. She has hx of motion sickness. Notes she has never been on a cruise before but gets terrible motion sickness in moving vehicles. She has tried dramamine in the past with no relief. She is wondering if there is anything she can be prescribed to  help with motion sickness during her cruise.  Review of Systems  Constitutional: Negative for chills, diaphoresis, fatigue and fever.  HENT: Negative for congestion.   Respiratory: Negative for wheezing.   Cardiovascular: Negative for chest pain and palpitations.  Neurological: Negative for dizziness and light-headedness.  Psychiatric/Behavioral: Positive for sleep disturbance.    Patient Active Problem List   Diagnosis Date Noted  . GERD (gastroesophageal reflux disease) 12/25/2014  . Obesity 12/25/2014  . Mild persistent asthma in adult without complication 12/25/2014  . Family history of coronary artery disease 12/25/2014  . Allergic rhinitis due to pollen 12/25/2014  . Family history of premature CAD 12/25/2014    Current Outpatient Prescriptions on File Prior to Visit  Medication Sig Dispense Refill  . betamethasone valerate (VALISONE) 0.1 % cream APPLY TO AFFECTED AREA TWICE A DAY AS NEEDED EXTERNALLY  1  . COMBIVENT RESPIMAT 20-100 MCG/ACT AERS respimat INHALE 1 PUFF INTO THE LUNGS EVERY 6 (SIX) HOURS AS NEEDED FOR WHEEZING. 1 Inhaler 4  . escitalopram (LEXAPRO) 10 MG tablet Take 1 tablet (10 mg total) by mouth daily. 90 tablet 1  . hydrOXYzine (ATARAX/VISTARIL) 50 MG tablet TAKE 1/2-2 TABLETS AS NEEDED FOR SLEEP. 90 tablet 0  . lisinopril (PRINIVIL,ZESTRIL) 10 MG tablet TAKE 1 TABLET (10 MG TOTAL) BY MOUTH DAILY. 90 tablet 1  . montelukast (SINGULAIR) 10 MG tablet TAKE 1 TABLET (10 MG TOTAL) BY MOUTH DAILY. 30 tablet 6  . ALPRAZolam (XANAX) 0.5 MG tablet Take 0.5 mg by mouth at  bedtime as needed for anxiety.    . BELSOMRA 10 MG TABS TAKE 1 TABLET BY MOUTH 30 MINUTES BEFORE BEDTIME. DO MOT TAKE IF YOU CANNOT GET 7 HOURS OF SLEEP (Patient not taking: Reported on 04/20/2017) 90 tablet 1   No current facility-administered medications on file prior to visit.     No Known Allergies    Social History   Social History  . Marital status: Married    Spouse name: N/A  . Number  of children: 4  . Years of education: N/A   Occupational History  . Not on file.   Social History Main Topics  . Smoking status: Never Smoker  . Smokeless tobacco: Never Used  . Alcohol use No  . Drug use: No  . Sexual activity: Yes   Other Topics Concern  . Not on file   Social History Narrative  . No narrative on file    Objective:  BP 132/88 (BP Location: Right Arm, Patient Position: Sitting, Cuff Size: Normal)   Pulse 92   Temp 98.8 F (37.1 C) (Oral)   Resp 18   Ht 5' 4.57" (1.64 m)   Wt 210 lb 3.2 oz (95.3 kg)   LMP  (Exact Date)   SpO2 99%   BMI 35.45 kg/m   Physical Exam  Constitutional: She is oriented to person, place, and time and well-developed, well-nourished, and in no distress.  HENT:  Head: Normocephalic and atraumatic.  Eyes: Conjunctivae are normal.  Neck: Normal range of motion.  Cardiovascular: Normal rate, regular rhythm and normal heart sounds.   Pulmonary/Chest: Effort normal and breath sounds normal. She has no decreased breath sounds. She has no wheezes. She has no rhonchi. She has no rales.  Neurological: She is alert and oriented to person, place, and time. Gait normal.  Skin: Skin is warm and dry.  Psychiatric: Affect normal.  Vitals reviewed.   Assessment and Plan :  1. Essential hypertension HTN appears to be well controlled on lisinopril. However, pt appears to be experiencing ACE-I induced cough. Lung exam normal. Vitals stable. Recommend discontinuing lisinopril . Start HCTZ 12.5mg  daily.  Instructed to check bp outside of office over the next couple of weeks. Contact me if consistently >140/90.  - hydrochlorothiazide (MICROZIDE) 12.5 MG capsule; Take 1 capsule (12.5 mg total) by mouth daily.  Dispense: 90 capsule; Refill: 1 2. Cough Likely ACE-I induced.  - hydrochlorothiazide (MICROZIDE) 12.5 MG capsule; Take 1 capsule (12.5 mg total) by mouth daily.  Dispense: 90 capsule; Refill: 1  3. Anxiety and depression Well  controlled at this time. Pt is clearly benefiting from lexapro. Had a thorough discussion with pt about benefits of lexapro vs side effects such as diarrhea. Educated pt that there are many other medications, even within the same class, that we could switch to, which may help resolve this issue but could also lead to potentially worsening of pt's anxiety and depression. After much consideration, pt would like to continue with lexapro at this time until she returns from her cruise. After her cruise, she will return to the office and we will try switching to a different SSRI, such as celexa. In the meantime, will provide short term relief for diarrhea with lomotil. Consider further work up if no improvement in diarrhea despite change in medication.   4. Diarrhea, unspecified type - diphenoxylate-atropine (LOMOTIL) 2.5-0.025 MG tablet; Take 1 tablet by mouth 4 (four) times daily as needed for diarrhea or loose stools.  Dispense: 30 tablet; Refill: 0  5. History of motion sickness Recommend scopolamine patch. Educated on proper use and potential side effects.  - scopolamine (TRANSDERM-SCOP, 1.5 MG,) 1 MG/3DAYS; Place 1 patch (1.5 mg total) onto the skin every 3 (three) days. Start >4 hours prior to event. Do no cut patch  Dispense: 10 patch; Refill: 0  A total of 25 minutes was spent in the room with the patient, greater than 50% of which was in counseling/coordination of care regarding HTN, cough, anxiety/depression, diarrhea, and motion sickness.  Benjiman Core PA-C di [facility] at [facility] 04/20/2017 6:01 PM

## 2017-04-20 NOTE — Patient Instructions (Addendum)
For hypertension, stop lisinopril daily. Start taking HCTZ 12.5 mg daily. Monitor your bp while you are on this medication. Your goal is <140/90. It typically takes about a week for this medication to be fully effective. If it is >140/90 in one week, contact me so we can talk about increasing it.  For diarrhea, this is likely due to lexapro but I do not want to change that medication right now due to your cruise. Therefore, I am going to give you a prescription for lomotil to use up to four times a day for diarrhea. Come to the office after you cruise and we can talk about changing your lexapro.   For motion sickness, I have given you a prescription for a patch to use. This is a 1 mg transdermal patch, applied behind the ear at least four hours (preferably 12 hours) before exposure to motion. Patches should be replaced every 72 hours if needed.Transdermal scopolamine is generally well tolerated. Potential side effects of scopolamine include sedation, blurred vision, dry mouth and, in older adults, confusion and urinary retention. Some patients develop dermatitis from the scopolamine patch  Bland Diet A bland diet consists of foods that do not have a lot of fat or fiber. Foods without fat or fiber are easier for the body to digest. They are also less likely to irritate your mouth, throat, stomach, and other parts of your gastrointestinal tract. A bland diet is sometimes called a BRAT diet. What is my plan? Your health care provider or dietitian may recommend specific changes to your diet to prevent and treat your symptoms, such as:  Eating small meals often.  Cooking food until it is soft enough to chew easily.  Chewing your food well.  Drinking fluids slowly.  Not eating foods that are very spicy, sour, or fatty.  Not eating citrus fruits, such as oranges and grapefruit.  What do I need to know about this diet?  Eat a variety of foods from the bland diet food list.  Do not follow a bland  diet longer than you have to.  Ask your health care provider whether you should take vitamins. What foods can I eat? Grains  Hot cereals, such as cream of wheat. Bread, crackers, or tortillas made from refined white flour. Rice. Vegetables Canned or cooked vegetables. Mashed or boiled potatoes. Fruits Bananas. Applesauce. Other types of cooked or canned fruit with the skin and seeds removed, such as canned peaches or pears. Meats and Other Protein Sources Scrambled eggs. Creamy peanut butter or other nut butters. Lean, well-cooked meats, such as chicken or fish. Tofu. Soups or broths. Dairy Low-fat dairy products, such as milk, cottage cheese, or yogurt. Beverages Water. Herbal tea. Apple juice. Sweets and Desserts Pudding. Custard. Fruit gelatin. Ice cream. Fats and Oils Mild salad dressings. Canola or olive oil. The items listed above may not be a complete list of allowed foods or beverages. Contact your dietitian for more options. What foods are not recommended? Foods and ingredients that are often not recommended include:  Spicy foods, such as hot sauce or salsa.  Fried foods.  Sour foods, such as pickled or fermented foods.  Raw vegetables or fruits, especially citrus or berries.  Caffeinated drinks.  Alcohol.  Strongly flavored seasonings or condiments.  The items listed above may not be a complete list of foods and beverages that are not allowed. Contact your dietitian for more information. This information is not intended to replace advice given to you by your health care  provider. Make sure you discuss any questions you have with your health care provider. Document Released: 10/22/2015 Document Revised: 12/06/2015 Document Reviewed: 07/12/2014 Elsevier Interactive Patient Education  2018 [employer].    IF you received an x-ray today, you will receive an invoice from [facility]. Please contact [facility] at [phone] with questions or  concerns regarding your invoice.   IF you received labwork today, you will receive an invoice from [place]. Please contact [facility] at [phone] with questions or concerns regarding your invoice.   Our billing staff will not be able to assist you with questions regarding bills from these companies.  You will be contacted with the lab results as soon as they are available. The fastest way to get your results is to activate your My Chart account. Instructions are located on the last page of this paperwork. If you have not heard from [place] regarding the results in 2 weeks, please contact this office.

## 2017-05-12 ENCOUNTER — Other Ambulatory Visit: Payer: Self-pay | Admitting: Physician Assistant

## 2017-05-12 DIAGNOSIS — F4321 Adjustment disorder with depressed mood: Secondary | ICD-10-CM

## 2017-05-13 NOTE — Telephone Encounter (Signed)
Please advise/refill for escitalopram (LEXAPRO)

## 2017-06-26 ENCOUNTER — Other Ambulatory Visit: Payer: Self-pay | Admitting: Family Medicine

## 2017-07-13 ENCOUNTER — Encounter: Payer: Self-pay | Admitting: Physician Assistant

## 2017-07-13 ENCOUNTER — Ambulatory Visit: Payer: BC Managed Care – PPO | Admitting: Physician Assistant

## 2017-07-13 ENCOUNTER — Other Ambulatory Visit: Payer: Self-pay

## 2017-07-13 ENCOUNTER — Ambulatory Visit (INDEPENDENT_AMBULATORY_CARE_PROVIDER_SITE_OTHER): Payer: BC Managed Care – PPO

## 2017-07-13 VITALS — BP 130/88 | HR 83 | Temp 98.1°F | Resp 18 | Ht 65.16 in | Wt 214.8 lb

## 2017-07-13 DIAGNOSIS — R059 Cough, unspecified: Secondary | ICD-10-CM

## 2017-07-13 DIAGNOSIS — R062 Wheezing: Secondary | ICD-10-CM

## 2017-07-13 DIAGNOSIS — J4521 Mild intermittent asthma with (acute) exacerbation: Secondary | ICD-10-CM | POA: Diagnosis not present

## 2017-07-13 DIAGNOSIS — R05 Cough: Secondary | ICD-10-CM | POA: Diagnosis not present

## 2017-07-13 DIAGNOSIS — R0602 Shortness of breath: Secondary | ICD-10-CM

## 2017-07-13 LAB — POCT CBC
GRANULOCYTE PERCENT: 67.2 % (ref 37–80)
HEMATOCRIT: 42.9 % (ref 37.7–47.9)
Hemoglobin: 14.3 g/dL (ref 12.2–16.2)
Lymph, poc: 2.2 (ref 0.6–3.4)
MCH: 28.9 pg (ref 27–31.2)
MCHC: 33.2 g/dL (ref 31.8–35.4)
MCV: 87 fL (ref 80–97)
MID (CBC): 0.8 (ref 0–0.9)
MPV: 6.9 fL (ref 0–99.8)
POC GRANULOCYTE: 6.1 (ref 2–6.9)
POC LYMPH %: 24.3 % (ref 10–50)
POC MID %: 8.5 %M (ref 0–12)
Platelet Count, POC: 361 10*3/uL (ref 142–424)
RBC: 4.93 M/uL (ref 4.04–5.48)
RDW, POC: 13 %
WBC: 9.1 10*3/uL (ref 4.6–10.2)

## 2017-07-13 MED ORDER — IPRATROPIUM BROMIDE 0.02 % IN SOLN
0.5000 mg | Freq: Once | RESPIRATORY_TRACT | Status: AC
  Administered 2017-07-13: 0.5 mg via RESPIRATORY_TRACT

## 2017-07-13 MED ORDER — PREDNISONE 20 MG PO TABS: 40.0000 mg | ORAL_TABLET | Freq: Every day | ORAL | 0 refills | Status: AC

## 2017-07-13 MED ORDER — IPRATROPIUM-ALBUTEROL 20-100 MCG/ACT IN AERS: 1.0000 | INHALATION_SPRAY | Freq: Four times a day (QID) | RESPIRATORY_TRACT | 4 refills | Status: DC | PRN

## 2017-07-13 MED ORDER — ALBUTEROL SULFATE (2.5 MG/3ML) 0.083% IN NEBU
2.5000 mg | INHALATION_SOLUTION | Freq: Once | RESPIRATORY_TRACT | Status: AC
  Administered 2017-07-13: 2.5 mg via RESPIRATORY_TRACT

## 2017-07-13 NOTE — Patient Instructions (Addendum)
Your history and physical exam findings are consistent with acute asthma exacerbation.  I recommend treating with prednisone. We are going to treat your underlying inflammation with oral prednisone. Prednisone is a steroid and can cause side effects such as headache, irritability, nausea, vomiting, increased heart rate, increased blood pressure, increased blood sugar, appetite changes, and insomnia. Please take tablets in the morning with a full meal to help decrease the chances of these side effects.   I also recommend using your combivent inhaler every 4-6 hours as needed for wheezing. Please return in 2-3 days for reevaluation.  If any of her symptoms worsen, seek care sooner. Thank you for letting me participate in your health and well being.    Acute Bronchitis, Adult Acute bronchitis is when air tubes (bronchi) in the lungs suddenly get swollen. The condition can make it hard to breathe. It can also cause these symptoms:  A cough.  Coughing up clear, yellow, or green mucus.  Wheezing.  Chest congestion.  Shortness of breath.  A fever.  Body aches.  Chills.  A sore throat.  Follow these instructions at home: Medicines  Take over-the-counter and prescription medicines only as told by your doctor.  If you were prescribed an antibiotic medicine, take it as told by your doctor. Do not stop taking the antibiotic even if you start to feel better. General instructions  Rest.  Drink enough fluids to keep your pee (urine) clear or pale yellow.  Avoid smoking and secondhand smoke. If you smoke and you need help quitting, ask your doctor. Quitting will help your lungs heal faster.  Use an inhaler, cool mist vaporizer, or humidifier as told by your doctor.  Keep all follow-up visits as told by your doctor. This is important. How is this prevented? To lower your risk of getting this condition again:  Wash your hands often with soap and water. If you cannot use soap and water,  use hand sanitizer.  Avoid contact with people who have cold symptoms.  Try not to touch your hands to your mouth, nose, or eyes.  Make sure to get the flu shot every year.  Contact a doctor if:  Your symptoms do not get better in 2 weeks. Get help right away if:  You cough up blood.  You have chest pain.  You have very bad shortness of breath.  You become dehydrated.  You faint (pass out) or keep feeling like you are going to pass out.  You keep throwing up (vomiting).  You have a very bad headache.  Your fever or chills gets worse. This information is not intended to replace advice given to you by your health care provider. Make sure you discuss any questions you have with your health care provider. Document Released: 12/17/2007 Document Revised: 02/06/2016 Document Reviewed: 12/19/2015 Elsevier Interactive Patient Education  2018 [employer].     Asthma, Acute Bronchospasm Acute bronchospasm caused by asthma is also referred to as an asthma attack. Bronchospasm means your air passages become narrowed. The narrowing is caused by inflammation and tightening of the muscles in the air tubes (bronchi) in your lungs. This can make it hard to breathe or cause you to wheeze and cough. What are the causes? Possible triggers are:  Animal dander from the skin, hair, or feathers of animals.  Dust mites contained in house dust.  Cockroaches.  Pollen from trees or grass.  Mold.  Cigarette or tobacco smoke.  Air pollutants such as dust, household cleaners, hair sprays, aerosol  sprays, paint fumes, strong chemicals, or strong odors.  Cold air or weather changes. Cold air may trigger inflammation. Winds increase molds and pollens in the air.  Strong emotions such as crying or laughing hard.  Stress.  Certain medicines such as aspirin or beta-blockers.  Sulfites in foods and drinks, such as dried fruits and wine.  Infections or inflammatory conditions, such as a  flu, cold, or inflammation of the nasal membranes (rhinitis).  Gastroesophageal reflux disease (GERD). GERD is a condition where stomach acid backs up into your esophagus.  Exercise or strenuous activity.  What are the signs or symptoms?  Wheezing.  Excessive coughing, particularly at night.  Chest tightness.  Shortness of breath. How is this diagnosed? Your health care provider will ask you about your medical history and perform a physical exam. A chest X-ray or blood testing may be performed to look for other causes of your symptoms or other conditions that may have triggered your asthma attack. How is this treated? Treatment is aimed at reducing inflammation and opening up the airways in your lungs. Most asthma attacks are treated with inhaled medicines. These include quick relief or rescue medicines (such as bronchodilators) and controller medicines (such as inhaled corticosteroids). These medicines are sometimes given through an inhaler or a nebulizer. Systemic steroid medicine taken by mouth or given through an IV tube also can be used to reduce the inflammation when an attack is moderate or severe. Antibiotic medicines are only used if a bacterial infection is present. Follow these instructions at home:  Rest.  Drink plenty of liquids. This helps the mucus to remain thin and be easily coughed up. Only use caffeine in moderation and do not use alcohol until you have recovered from your illness.  Do not smoke. Avoid being exposed to secondhand smoke.  You play a critical role in keeping yourself in good health. Avoid exposure to things that cause you to wheeze or to have breathing problems.  Keep your medicines up-to-date and available. Carefully follow your health care provider's treatment plan.  Take your medicine exactly as prescribed.  When pollen or pollution is bad, keep windows closed and use an air conditioner or go to places with air conditioning.  Asthma requires  careful medical care. See your health care provider for a follow-up as advised. If you are more than [redacted] weeks pregnant and you were prescribed any new medicines, let your obstetrician know about the visit and how you are doing. Follow up with your health care provider as directed.  After you have recovered from your asthma attack, make an appointment with your outpatient doctor to talk about ways to reduce the likelihood of future attacks. If you do not have a doctor who manages your asthma, make an appointment with a primary care doctor to discuss your asthma. Get help right away if:  You are getting worse.  You have trouble breathing. If severe, call your local emergency services (911 in the [place]  You develop chest pain or discomfort.  You are vomiting.  You are not able to keep fluids down.  You are coughing up yellow, green, brown, or bloody sputum.  You have a fever and your symptoms suddenly get worse.  You have trouble swallowing. This information is not intended to replace advice given to you by your health care provider. Make sure you discuss any questions you have with your health care provider. Document Released: 10/15/2006 Document Revised: 12/12/2015 Document Reviewed: 01/05/2013 Elsevier Interactive Patient Education  2017  [employer]  IF you received an x-ray today, you will receive an invoice from [facility]. Please contact [facility] at [phone] with questions or concerns regarding your invoice.   IF you received labwork today, you will receive an invoice from [facility]. Please contact [facility] at [phone] with questions or concerns regarding your invoice.   Our billing staff will not be able to assist you with questions regarding bills from these companies.  You will be contacted with the lab results as soon as they are available. The fastest way to get your results is to activate your My Chart account. Instructions are located on the  last page of this paperwork. If you have not heard from us regarding the results in 2 weeks, please contact this office.

## 2017-07-13 NOTE — Progress Notes (Signed)
MRN: [id number] DOB: 1971/01/26  Subjective:   Wendy Shannon is a 46 y.o. female presenting for chief complaint of Cough (X 2 mtg- off and on with some sore throat) and Shortness of Breath (X 1 week) . Had a cold in November with cough and chest congestion. Cough seemed to resolve but since then she has felt some postnasal drip and SOB for a week. Feels short of breath all the time but it does worsen with exertion. Has wheezing. Will have some chest tightness. Denies fever, chills, chest pain, heart palpitations, lower leg swelling, orthopnea, diaphoresis, nausea, and vomiting. Denies ear pain, sore throat, and itchy watery eyes.  Notes the feeling reminds her of when she had pneumonia.  Has not had sick contact with anyone. Has history of seasonal allergies, takes Singulair daily. Has history of asthma, typically uses rescue inhaler every few days but this past week has had to use it multiple times a day. It does help. Has not had an exacerbation in years.   Patient has not had flu shot this season. Denies smoking. Denies any other aggravating or relieving factors, no other questions or concerns.  Saxon has a current medication list which includes the following prescription(s): belsomra, betamethasone valerate, combivent respimat, diphenoxylate-atropine, escitalopram, hydrochlorothiazide, montelukast, alprazolam, hydroxyzine, lisinopril, and scopolamine, and the following Facility-Administered Medications: albuterol and ipratropium. Also is allergic to ace inhibitors.  Kyann  has a past medical history of Asthma and History of chicken pox. Also  has a past surgical history that includes Tonsillectomy; Breast surgery (Bilateral, 2007); and Tonsillectomy and adenoidectomy (1984).    Social History   Socioeconomic History  . Marital status: Married    Spouse name: Acupuncturist  . Number of children: 4  . Years of education: Not on file  . Highest education level: Not on file  Social Needs  .  Financial resource strain: Not on file  . Food insecurity - worry: Not on file  . Food insecurity - inability: Not on file  . Transportation needs - medical: Not on file  . Transportation needs - non-medical: Not on file  Occupational History  . Not on file  Tobacco Use  . Smoking status: Never Smoker  . Smokeless tobacco: Never Used  Substance and Sexual Activity  . Alcohol use: No  . Drug use: No  . Sexual activity: Yes  Other Topics Concern  . Not on file  Social History Narrative  . Not on file    Objective:   Vitals: BP 130/88 (BP Location: Left Arm, Patient Position: Sitting, Cuff Size: Normal)   Pulse 83   Temp 98.1 F (36.7 C) (Oral)   Resp 18   Ht 5' 5.16" (1.655 m)   Wt 214 lb 12.8 oz (97.4 kg)   SpO2 98%   BMI 35.57 kg/m   Physical Exam  Constitutional: She is oriented to person, place, and time. She appears well-developed and well-nourished.  Non-toxic appearance. She does not appear ill.  HENT:  Head: Normocephalic and atraumatic.  Right Ear: Tympanic membrane, external ear and ear canal normal.  Left Ear: Tympanic membrane, external ear and ear canal normal.  Nose: Mucosal edema present. Rhinorrhea: moderate bilaterally. Right sinus exhibits no maxillary sinus tenderness and no frontal sinus tenderness. Left sinus exhibits no maxillary sinus tenderness and no frontal sinus tenderness.  Mouth/Throat: Uvula is midline, oropharynx is clear and moist and mucous membranes are normal. No tonsillar exudate.  Eyes: Conjunctivae are normal.  Neck: Normal range of  motion.  Pulmonary/Chest: Effort normal. No accessory muscle usage. No respiratory distress. She has no decreased breath sounds. She has wheezes (noted diffusely throughout lung fields bilaterally). She has no rhonchi. She has no rales.  Musculoskeletal:       Right lower leg: She exhibits no swelling.       Left lower leg: She exhibits no swelling.  Lymphadenopathy:       Head (right side): No  submental, no submandibular, no tonsillar, no preauricular, no posterior auricular and no occipital adenopathy present.       Head (left side): No submental, no submandibular, no tonsillar, no preauricular, no posterior auricular and no occipital adenopathy present.    She has no cervical adenopathy.       Right: No supraclavicular adenopathy present.       Left: No supraclavicular adenopathy present.  Neurological: She is alert and oriented to person, place, and time.  Skin: Skin is warm and dry.  Psychiatric: She has a normal mood and affect.  Vitals reviewed.   Results for orders placed or performed in visit on 07/13/17 (from the past 24 hour(s))  POCT CBC     Status: None   Collection Time: 07/13/17 12:43 PM  Result Value Ref Range   WBC 9.1 4.6 - 10.2 K/uL   Lymph, poc 2.2 0.6 - 3.4   POC LYMPH PERCENT 24.3 10 - 50 %L   MID (cbc) 0.8 0 - 0.9   POC MID % 8.5 0 - 12 %M   POC Granulocyte 6.1 2 - 6.9   Granulocyte percent 67.2 37 - 80 %G   RBC 4.93 4.04 - 5.48 M/uL   Hemoglobin 14.3 12.2 - 16.2 g/dL   HCT, POC 16.142.9 09.637.7 - 47.9 %   MCV 87.0 80 - 97 fL   MCH, POC 28.9 27 - 31.2 pg   MCHC 33.2 31.8 - 35.4 g/dL   RDW, POC 04.513.0 %   Platelet Count, POC 361 142 - 424 K/uL   MPV 6.9 0 - 99.8 fL    Dg Chest 2 View  Result Date: 07/13/2017 CLINICAL DATA:  Two months of intermittent cough associated with shortness of breath and wheezing. History of asthma. Abnormal chest exam today. Nonsmoker. EXAM: CHEST  2 VIEW COMPARISON:  PA and lateral chest x-ray of May 02, 2016 FINDINGS: Lungs are well-expanded. Motion artifact degrades the lateral film. There is no alveolar infiltrate or pleural effusion. The interstitial markings are subjectively mildly increased. The heart and pulmonary vascularity are normal. The mediastinum is normal in width. The bony thorax is unremarkable. IMPRESSION: Findings compatible with reactive airway disease and/or acute bronchitis. There is no alveolar pneumonia  nor CHF. Electronically Signed   By: David  Swazil[place]Jordan M.D.   On: 07/13/2017 12:38   Post duoneb, wheezing has significantly improved. Few scant wheezes noted on lung exam. Pt feels much better, no shortness of breath. Peak flow before duoneb was 350, ~85 % of predicted. Peak flow after breathing tx is 410, about 98 % of predicted.   Assessment and Plan :  1. Cough 2. Wheezing Improved.  - POCT CBC - DG Chest 2 View; Future - albuterol (PROVENTIL) (2.5 MG/3ML) 0.083% nebulizer solution 2.5 mg - ipratropium (ATROVENT) nebulizer solution 0.5 mg 3. Shortness of breath Resolved. - Care order/instruction - Check Pulse Oximetry while ambulating  4. Mild intermittent asthma with acute exacerbation Hx, PE findings, and CXR consistent with reactive airway disease. Breathing treatment improved pt's symptoms tremendously. She feels much  better. She is well appearing, no distress. Vitals stable. WBC normal. Will give Rx for oral prednisone. Recommend to use combivent inhaler every 4-6 hours prn for wheezing and SOB. Follow up in 2-3 days for reevaluation, or sooner if symptoms worsen.  - predniSONE (DELTASONE) 20 MG tablet; Take 2 tablets (40 mg total) by mouth daily with breakfast for 5 days.  Dispense: 10 tablet; Refill: 0 - Ipratropium-Albuterol (COMBIVENT RESPIMAT) 20-100 MCG/ACT AERS respimat; Inhale 1 puff into the lungs every 6 (six) hours as needed for wheezing.  Dispense: 1 Inhaler; Refill: 4  Benjiman CoreBrittany Rithik Odea, PA-C  Primary [facility] at [facility] 07/13/2017 12:51 PM

## 2017-07-16 ENCOUNTER — Ambulatory Visit: Payer: BC Managed Care – PPO | Admitting: Physician Assistant

## 2017-07-16 ENCOUNTER — Encounter: Payer: Self-pay | Admitting: Physician Assistant

## 2017-07-16 ENCOUNTER — Other Ambulatory Visit: Payer: Self-pay

## 2017-07-16 VITALS — BP 136/90 | HR 80 | Temp 99.3°F | Resp 18 | Ht 64.96 in | Wt 216.8 lb

## 2017-07-16 DIAGNOSIS — J4521 Mild intermittent asthma with (acute) exacerbation: Secondary | ICD-10-CM

## 2017-07-16 NOTE — Progress Notes (Signed)
Wendy Shannon  MRN: [id number] DOB: 06-29-1971  Subjective:  Wendy Shannon is a 47 y.o. female seen in office today for a chief complaint of follow-up.  Patient initially seen on 07/13/17 for cough and congestion.  Lung exam revealed diffuse wheezing throughout posterior lung fields bilaterally.  Chest x-ray revealed reactive airway disease.  Patient given breathing treatment and Rx for prednisone.  Today, she notes she feels 100% back to normal. Still has one day of prednisone left. Notes that her shortness of breath and cough resolved about 24 hours after starting prednisone.  She denies fever, cough, wheezing, shortness of breath, chills, diaphoresis, chest pain, headache, nausea, and vomiting.  She has no other questions or complaints.  Review of Systems  Per HPI  Patient Active Problem List   Diagnosis Date Noted  . GERD (gastroesophageal reflux disease) 12/25/2014  . Obesity 12/25/2014  . Mild persistent asthma in adult without complication 12/25/2014  . Family history of coronary artery disease 12/25/2014  . Allergic rhinitis due to pollen 12/25/2014  . Family history of premature CAD 12/25/2014    Current Outpatient Medications on File Prior to Visit  Medication Sig Dispense Refill  . BELSOMRA 10 MG TABS TAKE 1 TABLET BY MOUTH 30 MINUTES BEFORE BEDTIME. DO MOT TAKE IF YOU CANNOT GET 7 HOURS OF SLEEP 90 tablet 1  . betamethasone valerate (VALISONE) 0.1 % cream APPLY TO AFFECTED AREA TWICE A DAY AS NEEDED EXTERNALLY  1  . diphenoxylate-atropine (LOMOTIL) 2.5-0.025 MG tablet Take 1 tablet by mouth 4 (four) times daily as needed for diarrhea or loose stools. 30 tablet 0  . escitalopram (LEXAPRO) 10 MG tablet TAKE 1 TABLET (10 MG TOTAL) BY MOUTH DAILY. 90 tablet 1  . hydrochlorothiazide (MICROZIDE) 12.5 MG capsule Take 1 capsule (12.5 mg total) by mouth daily. 90 capsule 1  . Ipratropium-Albuterol (COMBIVENT RESPIMAT) 20-100 MCG/ACT AERS respimat Inhale 1 puff into the lungs  every 6 (six) hours as needed for wheezing. 1 Inhaler 4  . montelukast (SINGULAIR) 10 MG tablet TAKE 1 TABLET (10 MG TOTAL) BY MOUTH DAILY. 30 tablet 6  . predniSONE (DELTASONE) 20 MG tablet Take 2 tablets (40 mg total) by mouth daily with breakfast for 5 days. 10 tablet 0  . scopolamine (TRANSDERM-SCOP, 1.5 MG,) 1 MG/3DAYS Place 1 patch (1.5 mg total) onto the skin every 3 (three) days. Start >4 hours prior to event. Do no cut patch 10 patch 0  . ALPRAZolam (XANAX) 0.5 MG tablet Take 0.5 mg by mouth at bedtime as needed for anxiety.    . hydrOXYzine (ATARAX/VISTARIL) 50 MG tablet TAKE 1/2-2 TABLETS AS NEEDED FOR SLEEP. (Patient not taking: Reported on 07/13/2017) 90 tablet 0  . lisinopril (PRINIVIL,ZESTRIL) 10 MG tablet TAKE 1 TABLET (10 MG TOTAL) BY MOUTH DAILY. (Patient not taking: Reported on 07/13/2017) 90 tablet 1   No current facility-administered medications on file prior to visit.     Allergies  Allergen Reactions  . Ace Inhibitors Cough     Objective:  BP 136/90 (BP Location: Right Arm, Patient Position: Sitting)   Pulse 80   Temp 99.3 F (37.4 C) (Oral)   Resp 18   Ht 5' 4.96" (1.65 m)   Wt 216 lb 12.8 oz (98.3 kg)   SpO2 99%   BMI 36.12 kg/m   Physical Exam  Constitutional: She is oriented to person, place, and time and well-developed, well-nourished, and in no distress.  HENT:  Head: Normocephalic and atraumatic.  Eyes: Conjunctivae are  normal.  Neck: Normal range of motion.  Cardiovascular: Normal rate, regular rhythm and normal heart sounds.  Pulmonary/Chest: Effort normal and breath sounds normal. She has no decreased breath sounds. She has no wheezes. She has no rhonchi. She has no rales.  Neurological: She is alert and oriented to person, place, and time. Gait normal.  Skin: Skin is warm and dry.  Psychiatric: Affect normal.  Vitals reviewed.   Assessment and Plan :  1. Mild intermittent asthma with acute exacerbation Lungs CTAB.  Patient encouraged to  continue prednisone course until it is complete.  Follow-up as needed.  Benjiman Core PA-C  Primary Care at [facility] 07/16/2017 3:24 PM

## 2017-07-16 NOTE — Patient Instructions (Addendum)
I am glad you are feeling better!  Your blood pressure is likely elevated due to prednisone use.  After you complete prednisone, just make sure you monitor your values.  Goal is <140/90. Have a great start of the new year!    IF you received an x-ray today, you will receive an invoice from [facility]. Please contact [facility] at [phone] with questions or concerns regarding your invoice.   IF you received labwork today, you will receive an invoice from [facility]. Please contact [facility] at [phone] with questions or concerns regarding your invoice.   Our billing staff will not be able to assist you with questions regarding bills from these companies.  You will be contacted with the lab results as soon as they are available. The fastest way to get your results is to activate your My Chart account. Instructions are located on the last page of this paperwork. If you have not heard from us regarding the results in 2 weeks, please contact this office.

## 2017-07-17 ENCOUNTER — Telehealth: Payer: Self-pay | Admitting: Physician Assistant

## 2017-07-17 NOTE — Telephone Encounter (Signed)
Copied from CRM [id number]. Topic: Quick Communication - See Telephone Encounter >> Jul 17, 2017  8:40 AM Eston Mouldavis, Thimothy Barretta B wrote: CRM for notification. See Telephone encounter for:  Pt needs  Refill Ipratropium-Albuterol (COMBIVENT RESPIMAT) 20-100 MCG/ACT AERS respimat  -    but insurance will not cover until 1/8-  pt wants to know if she can get an alternate  affordable  rx  until she can get that refilled where insurance  will cover -  Pt saw  SloveniaBrittany Wiseman and said she knows the situation from visit yesterday   [facility]   07/17/17.

## 2017-07-20 ENCOUNTER — Telehealth: Payer: Self-pay

## 2017-07-20 MED ORDER — ALBUTEROL SULFATE HFA 108 (90 BASE) MCG/ACT IN AERS: 2.0000 | INHALATION_SPRAY | RESPIRATORY_TRACT | 1 refills | Status: DC | PRN

## 2017-07-20 MED ORDER — PREDNISONE 20 MG PO TABS: ORAL_TABLET | ORAL | 0 refills | Status: DC

## 2017-07-20 NOTE — Telephone Encounter (Signed)
Pt requesting alternative. She has refills on combivent. Please advise.   Copied from CRM [id number]. Topic: Quick Communication - See Telephone Encounter >> Jul 17, 2017  8:40 AM Eston Mouldavis, Cheri B wrote: CRM for notification. See Telephone encounter for:  Pt needs  Refill Ipratropium-Albuterol (COMBIVENT RESPIMAT) 20-100 MCG/ACT AERS respimat  -    but insurance will not cover until 1/8-  pt wants to know if she can get an alternate  affordable  rx  until she can get that refilled where insurance  will cover -  PT mentioned Sloven[place]Brittany Wiseman and said she knows the situation from visit yesterday   [facility]   07/17/17. >> Jul 20, 2017 10:39 AM Cecelia ByarsGreen, Temeka L, RMA wrote: Patient is calling to check status of inhaler refill and is requesting a call back to  Day she is completely out and is afraid she will not make it through the night without an inhaler please call pt today

## 2017-07-20 NOTE — Telephone Encounter (Signed)
Patient contacted via phone.  She is still having wheezing and has run out of her Combivent.  Her insurance will not cover until tomorrow.  She needs an additional refill for inhaler that she can afford.  I have prescribed albuterol inhaler.  She has completed her prednisone and the wheezing returned.  We had discussed putting her on a longer duration of prednisone at her last visit patient understands and agrees treatment.  And will do that right now.  She is monitoring her blood pressure at home and last value was 132/80.  Encouraged that if her wheezing returns after she finishes this extended duration of prednisone then to return to office for further evaluation. Meds ordered this encounter  Medications  . albuterol (PROVENTIL HFA;VENTOLIN HFA) 108 (90 Base) MCG/ACT inhaler    Sig: Inhale 2 puffs into the lungs every 4 (four) hours as needed for wheezing or shortness of breath (cough, shortness of breath or wheezing.).    Dispense:  1 Inhaler    Refill:  1    Order Specific Question:   Supervising Provider    Answer:   Ethelda ChickSMITH, KRISTI M [[id number]]  . predniSONE (DELTASONE) 20 MG tablet    Sig: 3-3-2-2-1-1-0.5-0.5. Take all tablets for that day in the am with food.    Dispense:  13 tablet    Refill:  0    Order Specific Question:   Supervising Provider    Answer:   Ethelda ChickSMITH, KRISTI M [[id number]]

## 2017-07-20 NOTE — Addendum Note (Signed)
Addended by: Benjiman CoreWISEMAN, Zakarie Sturdivant D on: 07/20/2017 04:20 PM   Modules accepted: Orders

## 2017-07-20 NOTE — Telephone Encounter (Signed)
See message below. Please advise/refill

## 2017-08-21 ENCOUNTER — Other Ambulatory Visit: Payer: Self-pay

## 2017-08-21 DIAGNOSIS — F4321 Adjustment disorder with depressed mood: Secondary | ICD-10-CM

## 2017-08-21 MED ORDER — ESCITALOPRAM OXALATE 10 MG PO TABS: 10.0000 mg | ORAL_TABLET | Freq: Every day | ORAL | 1 refills | Status: DC

## 2017-08-21 NOTE — Progress Notes (Signed)
Refill provided

## 2017-08-26 ENCOUNTER — Other Ambulatory Visit: Payer: Self-pay | Admitting: Physician Assistant

## 2017-08-26 DIAGNOSIS — R197 Diarrhea, unspecified: Secondary | ICD-10-CM

## 2017-08-28 NOTE — Telephone Encounter (Signed)
Please call patient, she will need an office visit for refill of this medication.

## 2017-08-29 NOTE — Telephone Encounter (Signed)
Called Pt - LMOVM with Brittany's message - pt needs OV

## 2017-09-02 ENCOUNTER — Ambulatory Visit: Payer: BC Managed Care – PPO | Admitting: Physician Assistant

## 2017-09-02 ENCOUNTER — Encounter: Payer: Self-pay | Admitting: Physician Assistant

## 2017-09-02 ENCOUNTER — Other Ambulatory Visit: Payer: Self-pay

## 2017-09-02 VITALS — BP 145/98 | HR 89 | Temp 99.6°F | Resp 18 | Ht 65.08 in | Wt 216.8 lb

## 2017-09-02 DIAGNOSIS — J4521 Mild intermittent asthma with (acute) exacerbation: Secondary | ICD-10-CM

## 2017-09-02 DIAGNOSIS — R062 Wheezing: Secondary | ICD-10-CM

## 2017-09-02 DIAGNOSIS — F32A Depression, unspecified: Secondary | ICD-10-CM

## 2017-09-02 DIAGNOSIS — I10 Essential (primary) hypertension: Secondary | ICD-10-CM

## 2017-09-02 DIAGNOSIS — F329 Major depressive disorder, single episode, unspecified: Secondary | ICD-10-CM

## 2017-09-02 DIAGNOSIS — R197 Diarrhea, unspecified: Secondary | ICD-10-CM | POA: Diagnosis not present

## 2017-09-02 DIAGNOSIS — F419 Anxiety disorder, unspecified: Secondary | ICD-10-CM

## 2017-09-02 MED ORDER — BECLOMETHASONE DIPROP HFA 40 MCG/ACT IN AERB: 1.0000 | INHALATION_SPRAY | Freq: Two times a day (BID) | RESPIRATORY_TRACT | 2 refills | Status: DC

## 2017-09-02 MED ORDER — PREDNISONE 20 MG PO TABS: 40.0000 mg | ORAL_TABLET | Freq: Every day | ORAL | 0 refills | Status: AC

## 2017-09-02 MED ORDER — ALBUTEROL SULFATE (2.5 MG/3ML) 0.083% IN NEBU
2.5000 mg | INHALATION_SOLUTION | Freq: Once | RESPIRATORY_TRACT | Status: AC
  Administered 2017-09-02: 2.5 mg via RESPIRATORY_TRACT

## 2017-09-02 MED ORDER — DIPHENOXYLATE-ATROPINE 2.5-0.025 MG PO TABS: 1.0000 | ORAL_TABLET | Freq: Four times a day (QID) | ORAL | 0 refills | Status: DC | PRN

## 2017-09-02 MED ORDER — CITALOPRAM HYDROBROMIDE 10 MG PO TABS: 10.0000 mg | ORAL_TABLET | Freq: Every day | ORAL | 0 refills | Status: DC

## 2017-09-02 MED ORDER — IPRATROPIUM BROMIDE 0.02 % IN SOLN
0.5000 mg | Freq: Once | RESPIRATORY_TRACT | Status: AC
  Administered 2017-09-02: 0.5 mg via RESPIRATORY_TRACT

## 2017-09-02 NOTE — Patient Instructions (Addendum)
For wheezing, we are going to start Qvar twice daily.  Use albuterol as needed every 4-6 hours for wheezing. We are going to treat your underlying inflammation with oral prednisone. Prednisone is a steroid and can cause side effects such as headache, irritability, nausea, vomiting, increased heart rate, increased blood pressure, increased blood sugar, appetite changes, and insomnia. Please take tablets in the morning with a full meal to help decrease the chances of these side effects.    For anxiety, discontinue Lexapro at this time.  Start Celexa 10 mg daily.  We will likely need to increase this dose.  So you are aware, we change SSRIs, you can experience increase in anxiety and depression.  If you develop any suicidal thoughts, please contact our office or seek care immediately.  I would like you to follow-up in 4-6 weeks for reevaluation.  I have given you prescription for Lomotil to use as needed.    Asthma, Adult Asthma is a condition of the lungs in which the airways tighten and narrow. Asthma can make it hard to breathe. Asthma cannot be cured, but medicine and lifestyle changes can help control it. Asthma may be started (triggered) by:  Animal skin flakes (dander).  Dust.  Cockroaches.  Pollen.  Mold.  Smoke.  Cleaning products.  Hair sprays or aerosol sprays.  Paint fumes or strong smells.  Cold air, weather changes, and winds.  Crying or laughing hard.  Stress.  Certain medicines or drugs.  Foods, such as dried fruit, potato chips, and sparkling grape juice.  Infections or conditions (colds, flu).  Exercise.  Certain medical conditions or diseases.  Exercise or tiring activities.  Follow these instructions at home:  Take medicine as told by your doctor.  Use a peak flow meter as told by your doctor. A peak flow meter is a tool that measures how well the lungs are working.  Record and keep track of the peak flow meter's readings.  Understand and use the  asthma action plan. An asthma action plan is a written plan for taking care of your asthma and treating your attacks.  To help prevent asthma attacks: ? Do not smoke. Stay away from secondhand smoke. ? Change your heating and air conditioning filter often. ? Limit your use of fireplaces and wood stoves. ? Get rid of pests (such as roaches and mice) and their droppings. ? Throw away plants if you see mold on them. ? Clean your floors. Dust regularly. Use cleaning products that do not smell. ? Have someone vacuum when you are not home. Use a vacuum cleaner with a HEPA filter if possible. ? Replace carpet with wood, tile, or vinyl flooring. Carpet can trap animal skin flakes and dust. ? Use allergy-proof pillows, mattress covers, and box spring covers. ? Wash bed sheets and blankets every week in hot water and dry them in a dryer. ? Use blankets that are made of polyester or cotton. ? Clean bathrooms and kitchens with bleach. If possible, have someone repaint the walls in these rooms with mold-resistant paint. Keep out of the rooms that are being cleaned and painted. ? Wash hands often. Contact a doctor if:  You have make a whistling sound when breaking (wheeze), have shortness of breath, or have a cough even if taking medicine to prevent attacks.  The colored mucus you cough up (sputum) is thicker than usual.  The colored mucus you cough up changes from clear or white to yellow, green, gray, or bloody.  You have  problems from the medicine you are taking such as: ? A rash. ? Itching. ? Swelling. ? Trouble breathing.  You need reliever medicines more than 2-3 times a week.  Your peak flow measurement is still at 50-79% of your personal best after following the action plan for 1 hour.  You have a fever. Get help right away if:  You seem to be worse and are not responding to medicine during an asthma attack.  You are short of breath even at rest.  You get short of breath when doing  very little activity.  You have trouble eating, drinking, or talking.  You have chest pain.  You have a fast heartbeat.  Your lips or fingernails start to turn blue.  You are light-headed, dizzy, or faint.  Your peak flow is less than 50% of your personal best. This information is not intended to replace advice given to you by your health care provider. Make sure you discuss any questions you have with your health care provider. Document Released: 12/17/2007 Document Revised: 12/06/2015 Document Reviewed: 01/27/2013 Elsevier Interactive Patient Education  2017 [employer]  IF you received an x-ray today, you will receive an invoice from [facility]. Please contact [facility] at [phone] with questions or concerns regarding your invoice.   IF you received labwork today, you will receive an invoice from [facility]. Please contact [facility] at [phone] with questions or concerns regarding your invoice.   Our billing staff will not be able to assist you with questions regarding bills from these companies.  You will be contacted with the lab results as soon as they are available. The fastest way to get your results is to activate your My Chart account. Instructions are located on the last page of this paperwork. If you have not heard from us regarding the results in 2 weeks, please contact this office.

## 2017-09-02 NOTE — Progress Notes (Signed)
Wendy Shannon  MRN: [id number] DOB: [birth year]  Subjective:  Wendy Shannon is a 47 y.o. female seen in office today for a chief complaint of multiple complaints.  1) Diarrhea: Started when starting lexapro in 08/2016.  Has 3-5 loose stools per day, which has remained pretty consistent since starting this medication. Denies change in diet, recent travel, abdominal pain, nausea, vomiting, melena, hematochezia, and constipation. Was initially evaluated for this in 04/2017. Given Rx for lomitl and discussed changing SSRI. Pt wanted to hold off on changing SSRI at that time due to a vacation. Lomitil helped but is ready to consider changing SSRI at this time.   2) Anxiety and depression: Less controlled than previously.  Having a hard time right now dealing with the fact that her husband cheated on her.  But they were initially doing counseling but have stopped.  Believes that they would both benefit from this.  She is having decreased interest in doing things.  Notes she gets home from work and just wants to stay at home.  Has had increased episodes of crying.  Denies recent panic attacks, suicidal and homicidal thoughts.  3) Wheezing and cough x a couple of months. Feels short of breath with certain activities.  Will occasionally have chest tightness. Wakes up multiple times throughout the night daily with wheezing.  Denies productive cough, chest pain, chills, diaphoresis, fever, nausea, vomiting. Denies recent illness. PMH of asthma.  No maintenance inhaler.  Using rescue inhaler multiple times daily.   Has history of seasonal allergies, takes Singulair daily. Denies smoking.  Last asthma exacerbation was in 06/2017.  Review of Systems  Constitutional: Negative for unexpected weight change.  HENT: Negative for congestion, sinus pain, sore throat and voice change.   Cardiovascular: Negative for palpitations and leg swelling.  Neurological: Negative for dizziness, light-headedness and  headaches.    Patient Active Problem List   Diagnosis Date Noted  . GERD (gastroesophageal reflux disease) 12/25/2014  . Obesity 12/25/2014  . Mild persistent asthma in adult without complication 12/25/2014  . Family history of coronary artery disease 12/25/2014  . Allergic rhinitis due to pollen 12/25/2014  . Family history of premature CAD 12/25/2014    Current Outpatient Medications on File Prior to Visit  Medication Sig Dispense Refill  . albuterol (PROVENTIL HFA;VENTOLIN HFA) 108 (90 Base) MCG/ACT inhaler Inhale 2 puffs into the lungs every 4 (four) hours as needed for wheezing or shortness of breath (cough, shortness of breath or wheezing.). 1 Inhaler 1  . ALPRAZolam (XANAX) 0.5 MG tablet Take 0.5 mg by mouth at bedtime as needed for anxiety.    . BELSOMRA 10 MG TABS TAKE 1 TABLET BY MOUTH 30 MINUTES BEFORE BEDTIME. DO MOT TAKE IF YOU CANNOT GET 7 HOURS OF SLEEP 90 tablet 1  . betamethasone valerate (VALISONE) 0.1 % cream APPLY TO AFFECTED AREA TWICE A DAY AS NEEDED EXTERNALLY  1  . diphenoxylate-atropine (LOMOTIL) 2.5-0.025 MG tablet Take 1 tablet by mouth 4 (four) times daily as needed for diarrhea or loose stools. 30 tablet 0  . escitalopram (LEXAPRO) 10 MG tablet Take 1 tablet (10 mg total) by mouth daily. 90 tablet 1  . hydrochlorothiazide (MICROZIDE) 12.5 MG capsule Take 1 capsule (12.5 mg total) by mouth daily. 90 capsule 1  . Ipratropium-Albuterol (COMBIVENT RESPIMAT) 20-100 MCG/ACT AERS respimat Inhale 1 puff into the lungs every 6 (six) hours as needed for wheezing. 1 Inhaler 4  . montelukast (SINGULAIR) 10 MG tablet TAKE 1 TABLET (10  MG TOTAL) BY MOUTH DAILY. 30 tablet 6  . hydrOXYzine (ATARAX/VISTARIL) 50 MG tablet TAKE 1/2-2 TABLETS AS NEEDED FOR SLEEP. (Patient not taking: Reported on 07/13/2017) 90 tablet 0  . predniSONE (DELTASONE) 20 MG tablet 3-3-2-2-1-1-0.5-0.5. Take all tablets for that day in the am with food. (Patient not taking: Reported on 09/02/2017) 13 tablet 0   . scopolamine (TRANSDERM-SCOP, 1.5 MG,) 1 MG/3DAYS Place 1 patch (1.5 mg total) onto the skin every 3 (three) days. Start >4 hours prior to event. Do no cut patch (Patient not taking: Reported on 09/02/2017) 10 patch 0   No current facility-administered medications on file prior to visit.     Allergies  Allergen Reactions  . Ace Inhibitors Cough     Objective:  BP (!) 137/97 (BP Location: Right Arm, Patient Position: Sitting, Cuff Size: Normal)   Pulse 89   Temp 99.6 F (37.6 C) (Oral)   Resp 18   Ht 5' 5.08" (1.653 m)   Wt 216 lb 12.8 oz (98.3 kg)   SpO2 97%   BMI 35.99 kg/m   Physical Exam  Constitutional: She is oriented to person, place, and time and well-developed, well-nourished, and in no distress.  HENT:  Head: Normocephalic and atraumatic.  Eyes: Conjunctivae are normal.  Neck: Normal range of motion.  Cardiovascular: Normal rate, regular rhythm and normal heart sounds.  Pulmonary/Chest: Effort normal. She has no decreased breath sounds. She has wheezes (diffuse throughout bilateral lung fields). She has no rhonchi. She has no rales.  Abdominal: Soft. Normal appearance and bowel sounds are normal. There is no tenderness. There is no rigidity, no guarding, no tenderness at McBurney's point and negative Murphy's sign.  Musculoskeletal:       Right lower leg: She exhibits no swelling.       Left lower leg: She exhibits no swelling.  Neurological: She is alert and oriented to person, place, and time. Gait normal.  Skin: Skin is warm and dry.  Psychiatric: Affect normal. She exhibits a depressed mood.  Tearful when discussing relationship with husband.  Vitals reviewed.  Post duoneb, pt reports improvement.  Upon auscultation, wheezing improved significantly.  Mild intermittent faint wheezes noted in bilateral posterior lung fields. No rales or rhonchi noted.   BP Readings from Last 3 Encounters:  09/02/17 (!) 145/98  07/16/17 136/90  07/13/17 130/88    Assessment  and Plan :  1. Diarrhea, unspecified type Discontinue Lexapro.  Start Celexa.  Rx for Lomotil given for short-term relief. - diphenoxylate-atropine (LOMOTIL) 2.5-0.025 MG tablet; Take 1 tablet by mouth 4 (four) times daily as needed for diarrhea or loose stools.  Dispense: 30 tablet; Refill: 0  2. Anxiety and depression Uncontrolled at this time.  No suicidal ideation.  Due to side effect of diarrhea since starting Lexapro, will attempt trial of Celexa.  Educated patient that symptoms of anxiety and depression can worsen when switching between SSRIs.  Notify our office if she develops any adverse reactions.  Will likely need to increase Celexa dose.  Strongly encouraged to set up an appointment with counselor as soon as possible.  Advised to follow-up in 4-6 weeks for reevaluation or sooner if symptoms worsen or she develops new concerning symptoms. - citalopram (CELEXA) 10 MG tablet; Take 1 tablet (10 mg total) by mouth daily.  Dispense: 90 tablet; Refill: 0  3. Wheezing - albuterol (PROVENTIL) (2.5 MG/3ML) 0.083% nebulizer solution 2.5 mg - ipratropium (ATROVENT) nebulizer solution 0.5 mg 4. Mild intermittent asthma with acute exacerbation Patient's  asthma is uncontrolled at this time.Wheezing improved with DuoNeb.  Few faint wheezes noted after DuoNeb.  Given Rx for prednisone.  Recommended use of albuterol rescue inhaler every 4-6 hours as needed for wheezing. Recommend daily maintenance inhaler along with a rescue inhaler.  Plan to perform PFTs in office at follow-up visit.  Advised to return to clinic if symptoms worsen, do not improve, or as needed.  - beclomethasone (QVAR) 40 MCG/ACT inhaler; Inhale 1 puff into the lungs 2 (two) times daily.  Dispense: 10.6 g; Refill: 2 - predniSONE (DELTASONE) 20 MG tablet; Take 2 tablets (40 mg total) by mouth daily with breakfast for 5 days.  Dispense: 10 tablet; Refill: 0  5. Essential hypertension Uncontrolled in office.  Patient has taken blood  pressure medication this morning.  She does not endorse stress at this time.  She is otherwise asymptomatic.  Instructed to check bp outside of office over the next couple of weeks. Return if consistently >140/90. Given strict ED precautions.   Benjiman Core PA-C  Primary [facility] at [facility] 09/02/2017 12:25 PM

## 2017-09-06 ENCOUNTER — Encounter: Payer: Self-pay | Admitting: Physician Assistant

## 2017-09-09 ENCOUNTER — Other Ambulatory Visit: Payer: Self-pay | Admitting: Physician Assistant

## 2017-09-09 MED ORDER — BECLOMETHASONE DIPROP HFA 40 MCG/ACT IN AERB: 1.0000 | INHALATION_SPRAY | Freq: Two times a day (BID) | RESPIRATORY_TRACT | 0 refills | Status: DC

## 2017-09-09 NOTE — Progress Notes (Signed)
Meds ordered this encounter  Medications  . beclomethasone (QVAR REDIHALER) 40 MCG/ACT inhaler    Sig: Inhale 1 puff into the lungs 2 (two) times daily.    Dispense:  10.6 g    Refill:  0    Order Specific Question:   Supervising Provider    Answer:   Ethelda ChickSMITH, KRISTI M [[id number]]

## 2017-09-15 ENCOUNTER — Other Ambulatory Visit: Payer: Self-pay

## 2017-09-15 ENCOUNTER — Emergency Department (HOSPITAL_COMMUNITY): Payer: BC Managed Care – PPO

## 2017-09-15 ENCOUNTER — Ambulatory Visit: Payer: Self-pay | Admitting: *Deleted

## 2017-09-15 ENCOUNTER — Observation Stay (HOSPITAL_COMMUNITY)
Admission: EM | Admit: 2017-09-15 | Discharge: 2017-09-16 | Disposition: A | Discharge status: Home / Self Care | Payer: BC Managed Care – PPO | Attending: Oncology | Admitting: Oncology

## 2017-09-15 ENCOUNTER — Encounter (HOSPITAL_COMMUNITY): Payer: Self-pay | Admitting: Emergency Medicine

## 2017-09-15 DIAGNOSIS — I10 Essential (primary) hypertension: Secondary | ICD-10-CM | POA: Insufficient documentation

## 2017-09-15 DIAGNOSIS — E669 Obesity, unspecified: Secondary | ICD-10-CM | POA: Insufficient documentation

## 2017-09-15 DIAGNOSIS — J45909 Unspecified asthma, uncomplicated: Secondary | ICD-10-CM | POA: Diagnosis not present

## 2017-09-15 DIAGNOSIS — R51 Headache: Secondary | ICD-10-CM | POA: Insufficient documentation

## 2017-09-15 DIAGNOSIS — R61 Generalized hyperhidrosis: Secondary | ICD-10-CM | POA: Diagnosis not present

## 2017-09-15 DIAGNOSIS — F329 Major depressive disorder, single episode, unspecified: Secondary | ICD-10-CM | POA: Diagnosis not present

## 2017-09-15 DIAGNOSIS — J4530 Mild persistent asthma, uncomplicated: Secondary | ICD-10-CM | POA: Diagnosis not present

## 2017-09-15 DIAGNOSIS — Z8249 Family history of ischemic heart disease and other diseases of the circulatory system: Secondary | ICD-10-CM | POA: Diagnosis not present

## 2017-09-15 DIAGNOSIS — F32A Depression, unspecified: Secondary | ICD-10-CM

## 2017-09-15 DIAGNOSIS — R197 Diarrhea, unspecified: Secondary | ICD-10-CM | POA: Insufficient documentation

## 2017-09-15 DIAGNOSIS — Z79899 Other long term (current) drug therapy: Secondary | ICD-10-CM | POA: Diagnosis not present

## 2017-09-15 DIAGNOSIS — Z8241 Family history of sudden cardiac death: Secondary | ICD-10-CM | POA: Diagnosis not present

## 2017-09-15 DIAGNOSIS — Z6835 Body mass index (BMI) 35.0-35.9, adult: Secondary | ICD-10-CM | POA: Insufficient documentation

## 2017-09-15 DIAGNOSIS — Z800 Family history of malignant neoplasm of digestive organs: Secondary | ICD-10-CM | POA: Diagnosis not present

## 2017-09-15 DIAGNOSIS — R0602 Shortness of breath: Secondary | ICD-10-CM | POA: Diagnosis not present

## 2017-09-15 DIAGNOSIS — R079 Chest pain, unspecified: Secondary | ICD-10-CM | POA: Diagnosis present

## 2017-09-15 DIAGNOSIS — F419 Anxiety disorder, unspecified: Secondary | ICD-10-CM | POA: Diagnosis not present

## 2017-09-15 DIAGNOSIS — Z888 Allergy status to other drugs, medicaments and biological substances status: Secondary | ICD-10-CM | POA: Diagnosis not present

## 2017-09-15 DIAGNOSIS — R0789 Other chest pain: Secondary | ICD-10-CM | POA: Diagnosis not present

## 2017-09-15 DIAGNOSIS — Z630 Problems in relationship with spouse or partner: Secondary | ICD-10-CM

## 2017-09-15 DIAGNOSIS — K219 Gastro-esophageal reflux disease without esophagitis: Secondary | ICD-10-CM | POA: Diagnosis not present

## 2017-09-15 DIAGNOSIS — Z7982 Long term (current) use of aspirin: Secondary | ICD-10-CM | POA: Insufficient documentation

## 2017-09-15 HISTORY — DX: Essential (primary) hypertension: I10

## 2017-09-15 LAB — BASIC METABOLIC PANEL
ANION GAP: 10 (ref 5–15)
BUN: 6 mg/dL (ref 6–20)
CALCIUM: 9.6 mg/dL (ref 8.9–10.3)
CO2: 26 mmol/L (ref 22–32)
Chloride: 102 mmol/L (ref 101–111)
Creatinine, Ser: 0.74 mg/dL (ref 0.44–1.00)
GFR calc Af Amer: >60 mL/min (ref >60)
GFR calc non Af Amer: >60 mL/min (ref >60)
GLUCOSE: 101 mg/dL — AB (ref 65–99)
Potassium: 4.3 mmol/L (ref 3.5–5.1)
Sodium: 138 mmol/L (ref 135–145)

## 2017-09-15 LAB — CBC
HCT: 47 % — ABNORMAL HIGH (ref 36.0–46.0)
Hemoglobin: 15.6 g/dL — ABNORMAL HIGH (ref 12.0–15.0)
MCH: 29.1 pg (ref 26.0–34.0)
MCHC: 33.2 g/dL (ref 30.0–36.0)
MCV: 87.5 fL (ref 78.0–100.0)
Platelets: 389 10*3/uL (ref 150–400)
RBC: 5.37 MIL/uL — ABNORMAL HIGH (ref 3.87–5.11)
RDW: 13.5 % (ref 11.5–15.5)
WBC: 10.4 10*3/uL (ref 4.0–10.5)

## 2017-09-15 LAB — I-STAT TROPONIN, ED
Troponin i, poc: 0 ng/mL (ref 0.00–0.08)
Troponin i, poc: 0 ng/mL (ref 0.00–0.08)

## 2017-09-15 LAB — I-STAT BETA HCG BLOOD, ED (MC, WL, AP ONLY): I-stat hCG, quantitative: <5 m[IU]/mL

## 2017-09-15 LAB — TROPONIN I: Troponin I: <0.03 ng/mL

## 2017-09-15 MED ORDER — ASPIRIN 81 MG PO CHEW
324.0000 mg | CHEWABLE_TABLET | Freq: Once | ORAL | Status: AC
  Administered 2017-09-15: 324 mg via ORAL
  Filled 2017-09-15: qty 4

## 2017-09-15 MED ORDER — HYDROCHLOROTHIAZIDE 12.5 MG PO CAPS
12.5000 mg | ORAL_CAPSULE | Freq: Every day | ORAL | Status: DC
  Administered 2017-09-16: 12.5 mg via ORAL
  Filled 2017-09-15: qty 1

## 2017-09-15 MED ORDER — MONTELUKAST SODIUM 10 MG PO TABS
10.0000 mg | ORAL_TABLET | Freq: Every day | ORAL | Status: DC
  Administered 2017-09-16: 10 mg via ORAL
  Filled 2017-09-15: qty 1

## 2017-09-15 MED ORDER — CITALOPRAM HYDROBROMIDE 10 MG PO TABS
10.0000 mg | ORAL_TABLET | Freq: Every day | ORAL | Status: DC
  Administered 2017-09-15 – 2017-09-16 (×2): 10 mg via ORAL
  Filled 2017-09-15 (×2): qty 1

## 2017-09-15 MED ORDER — ASPIRIN EC 81 MG PO TBEC
81.0000 mg | DELAYED_RELEASE_TABLET | Freq: Every day | ORAL | Status: DC
  Administered 2017-09-16: 81 mg via ORAL
  Filled 2017-09-15: qty 1

## 2017-09-15 MED ORDER — MORPHINE SULFATE (PF) 4 MG/ML IV SOLN: 2.0000 mg | INTRAVENOUS | Status: DC | PRN

## 2017-09-15 MED ORDER — ONDANSETRON HCL 4 MG/2ML IJ SOLN
4.0000 mg | Freq: Four times a day (QID) | INTRAMUSCULAR | Status: DC | PRN
Start: 2017-09-15 — End: 2017-09-16

## 2017-09-15 MED ORDER — ACETAMINOPHEN 325 MG PO TABS: 650.0000 mg | ORAL_TABLET | ORAL | Status: DC | PRN

## 2017-09-15 MED ORDER — ENOXAPARIN SODIUM 40 MG/0.4ML ~~LOC~~ SOLN
40.0000 mg | SUBCUTANEOUS | Status: DC
  Administered 2017-09-15: 40 mg via SUBCUTANEOUS
  Filled 2017-09-15: qty 0.4

## 2017-09-15 MED ORDER — BUDESONIDE 0.25 MG/2ML IN SUSP
0.2500 mg | Freq: Two times a day (BID) | RESPIRATORY_TRACT | Status: DC
  Filled 2017-09-15 (×2): qty 2

## 2017-09-15 NOTE — ED Triage Notes (Signed)
Pt arrives via POV from home states not feeling well last night. Had severe headache. Take blood pressure meds. Highest home reading today 157/113. Pt also c/o chest tightness and SOB with light activity.

## 2017-09-15 NOTE — ED Provider Notes (Signed)
[facility]   CSN: [id number] Arrival date & time: 09/15/17  1022     History   Chief Complaint Chief Complaint  Patient presents with  . Hypertension  . Headache    HPI Wendy Shannon is a 47 y.o. female.  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  Felt sweating and feeling of head pressure like hanging upside down on the monkey bars as well as tightness in the chest Tightness started yesterday AM, stayed in middle of chest, nothing made it better or worse. When got home last night laid on left side which helped and blood pressure went down.  Otherwise not worse with exertion, position nor deep breaths.  Sweating, dyspnea, chest pain yesterday and this AM.  No nausea.  Just diarrhea.   149/103 blood pressure last night, on way here was 157/113 No change in dose of blood pressure medications, taking hctz, 12.5mg , last year bp was high   Has had diarrhea, changed one of medications to see if that would help. Was antidepressent/antianxiety mediation.  Today having worsening diarrhea today as well, went 3 times today.  Started celexa on the 2/20.  Hx of htn, asthma Dad died from MI at 30yo No smoking etoh or other drugs Has never seen Cardiology or had stress test  Currently without chest tightness nor headache.  Past Medical History:  Diagnosis Date  . Asthma   . History of chicken pox   . Hypertension     Patient Active Problem List   Diagnosis Date Noted  . Chest pain 09/15/2017  . GERD (gastroesophageal reflux disease) 12/25/2014  . Obesity 12/25/2014  . Mild persistent asthma in adult without complication 12/25/2014  . Family history of coronary artery disease 12/25/2014  . Allergic rhinitis due to pollen 12/25/2014  . Family history of premature CAD 12/25/2014    Past Surgical History:  Procedure Laterality Date  . BREAST SURGERY Bilateral 2007   reduction  .  TONSILLECTOMY    . TONSILLECTOMY AND ADENOIDECTOMY  1984    OB History    Gravida Para Term Preterm AB Living   4 4           SAB TAB Ectopic Multiple Live Births                   Home Medications    Prior to Admission medications   Medication Sig Start Date End Date Taking? Authorizing Provider  albuterol (PROVENTIL HFA;VENTOLIN HFA) 108 (90 Base) MCG/ACT inhaler Inhale 2 puffs into the lungs every 4 (four) hours as needed for wheezing or shortness of breath (cough, shortness of breath or wheezing.). 07/20/17  Yes Barnett AbuWiseman, [place] D, PA-C  beclomethasone (QVAR REDIHALER) 40 MCG/ACT inhaler Inhale 1 puff into the lungs 2 (two) times daily. 09/09/17  Yes Barnett AbuWiseman, [place] D, PA-C  citalopram (CELEXA) 10 MG tablet Take 1 tablet (10 mg total) by mouth daily. 09/02/17  Yes Barnett AbuWiseman, [place] D, PA-C  diphenhydrAMINE (BENADRYL) 50 MG capsule Take 100 mg by mouth at bedtime.   Yes [provider]  diphenoxylate-atropine (LOMOTIL) 2.5-0.025 MG tablet Take 1 tablet by mouth 4 (four) times daily as needed for diarrhea or loose stools. 09/02/17  Yes Barnett AbuWiseman, [place] D, PA-C  hydrochlorothiazide (MICROZIDE) 12.5 MG capsule Take 1 capsule (12.5 mg total) by mouth daily. 04/20/17  Yes Barnett AbuWiseman, [place] D, PA-C  montelukast (SINGULAIR) 10 MG tablet TAKE 1 TABLET (10 MG TOTAL)  BY MOUTH DAILY. 06/29/17  Yes Wiseman, [place] D, PA-C  BELSOMRA 10 MG TABS TAKE 1 TABLET BY MOUTH 30 MINUTES BEFORE BEDTIME. DO MOT TAKE IF YOU CANNOT GET 7 HOURS OF SLEEP Patient not taking: Reported on 09/15/2017 12/02/16   Benjiman Core D, PA-C  hydrOXYzine (ATARAX/VISTARIL) 50 MG tablet TAKE 1/2-2 TABLETS AS NEEDED FOR SLEEP. Patient not taking: Reported on 07/13/2017 01/16/17   Morrell Riddle, PA-C    Family History Family History  Problem Relation Age of Onset  . Heart disease Father 50       died of MI at age 12  . Hyperlipidemia Paternal Grandmother   . Cancer Paternal Grandfather        pancreatic cancer     Social History Social History   Tobacco Use  . Smoking status: Never Smoker  . Smokeless tobacco: Never Used  Substance Use Topics  . Alcohol use: No  . Drug use: No     Allergies   Ace inhibitors   Review of Systems Review of Systems  Constitutional: Positive for diaphoresis. Negative for fever.  HENT: Negative for sore throat.   Eyes: Negative for visual disturbance.  Respiratory: Positive for shortness of breath. Negative for cough.   Cardiovascular: Positive for chest pain.  Gastrointestinal: Positive for diarrhea. Negative for abdominal pain, nausea and vomiting.  Genitourinary: Negative for difficulty urinating.  Musculoskeletal: Negative for back pain and neck pain.  Skin: Negative for rash.  Neurological: Positive for headaches. Negative for syncope, facial asymmetry, weakness, light-headedness and numbness.     Physical Exam Updated Vital Signs BP 113/79 (BP Location: Left Arm)   Pulse 85   Temp 98.6 F (37 C) (Oral)   Resp 18   Ht 5\' 5"  (1.651 m)   Wt 98.6 kg (217 lb 6.4 oz)   SpO2 96%   BMI 36.18 kg/m   Physical Exam  Constitutional: She is oriented to person, place, and time. She appears well-developed and well-nourished. No distress.  HENT:  Head: Normocephalic and atraumatic.  Eyes: Conjunctivae and EOM are normal.  Neck: Normal range of motion.  Cardiovascular: Normal rate, regular rhythm, normal heart sounds and intact distal pulses. Exam reveals no gallop and no friction rub.  No murmur heard. Pulmonary/Chest: Effort normal and breath sounds normal. No respiratory distress. She has no wheezes. She has no rales.  Abdominal: Soft. She exhibits no distension. There is no tenderness. There is no guarding.  Musculoskeletal: She exhibits no edema or tenderness.  Neurological: She is alert and oriented to person, place, and time.  Skin: Skin is warm and dry. No rash noted. She is not diaphoretic. No erythema.  Nursing note and vitals  reviewed.    ED Treatments / Results  Labs (all labs ordered are listed, but only abnormal results are displayed) Labs Reviewed  BASIC METABOLIC PANEL - Abnormal; Notable for the following components:      Result Value   Glucose, Bld 101 (*)    All other components within normal limits  CBC - Abnormal; Notable for the following components:   RBC 5.37 (*)    Hemoglobin 15.6 (*)    HCT 47.0 (*)    All other components within normal limits  TROPONIN I  TROPONIN I  RAPID URINE DRUG SCREEN, HOSP PERFORMED  TROPONIN I  HIV ANTIBODY (ROUTINE TESTING)  HEMOGLOBIN A1C  LIPID PANEL  I-STAT TROPONIN, ED  I-STAT BETA HCG BLOOD, ED ([facility], [facility], [facility] ONLY)  I-STAT TROPONIN, ED  EKG  EKG Interpretation  Date/Time:  Tuesday September 15 2017 11:03:07 EST Ventricular Rate:  83 PR Interval:  150 QRS Duration: 74 QT Interval:  386 QTC Calculation: 453 R Axis:   -55 Text Interpretation:  Normal sinus rhythm Left axis deviation Low voltage QRS Abnormal ECG No previous ECGs available Confirmed by Alvira Monday ([id number]) on 09/15/2017 1:41:55 PM       Radiology Dg Chest 2 View  Result Date: 09/15/2017 CLINICAL DATA:  Elevated blood pressure.  Chest tightness EXAM: CHEST  2 VIEW COMPARISON:  07/13/2017 FINDINGS: The heart size and mediastinal contours are within normal limits. Both lungs are clear. The visualized skeletal structures are unremarkable. IMPRESSION: No active cardiopulmonary disease. Electronically Signed   By: Signa Kell M.D.   On: 09/15/2017 11:44    Procedures Procedures (including critical care time)  Medications Ordered in ED Medications  acetaminophen (TYLENOL) tablet 650 mg (not administered)  ondansetron (ZOFRAN) injection 4 mg (not administered)  enoxaparin (LOVENOX) injection 40 mg (40 mg Subcutaneous Given 09/15/17 2012)  morphine 4 MG/ML injection 2 mg (not administered)  aspirin EC tablet 81 mg (not administered)  budesonide (PULMICORT) nebulizer solution 0.25 mg  (0.25 mg Inhalation Not Given 09/15/17 2239)  citalopram (CELEXA) tablet 10 mg (10 mg Oral Given 09/15/17 1733)  hydrochlorothiazide (MICROZIDE) capsule 12.5 mg (not administered)  montelukast (SINGULAIR) tablet 10 mg (not administered)  aspirin chewable tablet 324 mg (324 mg Oral Given 09/15/17 1441)     Initial Impression / Assessment and Plan / ED Course  I have reviewed the triage vital signs and the nursing notes.  Pertinent labs & imaging results that were available during my care of the patient were reviewed by me and considered in my medical decision making (see chart for details).     47yo female with history of htn, elevated BMI, family hx of early CAD with father in 59s, presents with concern for chest tightness and concern regarding elevated blood pressures. Blood pressures with mild elevation however spontaneously improving in ED.   Differential diagnosis for chest pain includes pulmonary embolus, dissection, pneumothorax, pneumonia, ACS, myocarditis, pericarditis.  EKG was done and evaluate by me and showed no acute ST changes and no signs of pericarditis. Chest x-ray was done and evaluated by me and radiology and showed  no sign of pneumonia or pneumothorax. Have low suspicion for PE, pt low risk Wells, have higher suspicion for other etiologies.  Initial troponin negative, however, given history and risk factors, patient is high risk HEART score of 4.  CP free at this time. Given aspirin. Will admit to medicine for chest pain observation.   Final Clinical Impressions(s) / ED Diagnoses   Final diagnoses:  Chest pain, unspecified type    ED Discharge Orders    None       Alvira Monday, MD 09/16/17 [phone]

## 2017-09-15 NOTE — Progress Notes (Signed)
Pt is crying. Pt states she found out 1 year ago that her husband had been having an affair for 5 years. Pt states this is when her anxiety started. RN provided emotional support to pt. Pt states her and her husband are currently getting marriage counseling.

## 2017-09-15 NOTE — H&P (Signed)
Date: 09/15/2017               Patient Name:  Wendy Shannon MRN: [id number]  DOB: [birth year] Age / Sex: 47 y.o., female   PCP: Magdalene River, PA-C         Medical Service: Internal Medicine Teaching Service         Attending Physician: Dr. Levert Feinstein, MD    First Contact: Dr. Minda Meo Pager: [phone]  Second Contact: Dr. Vincente Liberty Pager: [phone]       After Hours (After 5p/  First Contact Pager: [phone]  weekends / holidays): Second Contact Pager: [phone]   Chief Complaint: Chest tightness  History of Present Illness:  47 yo female with PMH of HTN and asthma presenting with chief complaint of chest tightness and elevated blood pressure. The patient states yesterday she had chest tightness, diaphoresis, and head pressure associated with an elevated blood pressure (157/113), she also experienced the same symptoms the morning of admission. The chest tightness is substernal and pressure like in quality, with radiation to the left neck. The patient cannot not say how long it last for, but typically lasts for hours to days. It has been occurring about once per week for about 1 year (since her diagnosis of HTN), not worsened by exertion or movement. The chest tightness improves with lying down. Does endorse shortness of breath with exertion with these events. Also states she has diarrhea associated with chest pain. She called her PCP who advised her to be evaluated in the ED.   She does endorse a lot of anxiety when she has these episodes because her father died at age 40 of an MI and he has associated nose bleed and diarrhea. Maternal grandparents also with history of CAD, MI, and CABG. Patient denies recent infection, fevers, or chills. Patient denies nausea, vomiting, or history of acid reflux.   ED Course: Vitals: Blood pressure (!) 140/111, pulse 108, temperature 98.8 F (37.1 C), resp. rate 16, SpO2 99 %. Labs: I-stat troponin negative, BMET unremarkable, CBC  unremarkable, Hgb 15.6 Meds: ASA 324 mg  Meds:  Current Meds  Medication Sig  . albuterol (PROVENTIL HFA;VENTOLIN HFA) 108 (90 Base) MCG/ACT inhaler Inhale 2 puffs into the lungs every 4 (four) hours as needed for wheezing or shortness of breath (cough, shortness of breath or wheezing.).  Marland Kitchen beclomethasone (QVAR REDIHALER) 40 MCG/ACT inhaler Inhale 1 puff into the lungs 2 (two) times daily.  . citalopram (CELEXA) 10 MG tablet Take 1 tablet (10 mg total) by mouth daily.  . diphenhydrAMINE (BENADRYL) 50 MG capsule Take 100 mg by mouth at bedtime.  . diphenoxylate-atropine (LOMOTIL) 2.5-0.025 MG tablet Take 1 tablet by mouth 4 (four) times daily as needed for diarrhea or loose stools.  . hydrochlorothiazide (MICROZIDE) 12.5 MG capsule Take 1 capsule (12.5 mg total) by mouth daily.  . montelukast (SINGULAIR) 10 MG tablet TAKE 1 TABLET (10 MG TOTAL) BY MOUTH DAILY.     Allergies: Allergies as of 09/15/2017 - Review Complete 09/15/2017  Allergen Reaction Noted  . Ace inhibitors Cough 04/22/2017   Past Medical History:  Diagnosis Date  . Asthma   . History of chicken pox   . Hypertension     Family History:  Family History  Problem Relation Age of Onset  . Heart disease Father 68       died of MI at age 45  . Hyperlipidemia Paternal Grandmother   . Cancer Paternal Grandfather  pancreatic cancer    Social History:  Social History   Tobacco Use  . Smoking status: Never Smoker  . Smokeless tobacco: Never Used  Substance Use Topics  . Alcohol use: No  . Drug use: No    Review of Systems: A complete ROS was negative except as per HPI.   Physical Exam: Blood pressure (!) 127/91, pulse 80, temperature 98.8 F (37.1 C), temperature source Oral, resp. rate 16, SpO2 99 %. Physical Exam  Constitutional: She is oriented to person, place, and time. She appears well-developed and well-nourished. No distress.  HENT:  Head: Normocephalic and atraumatic.  Mouth/Throat:  Oropharynx is clear and moist.  Eyes: Conjunctivae are normal. No scleral icterus.  Neck: Normal range of motion. Neck supple.  Cardiovascular: Normal rate, regular rhythm, normal heart sounds and intact distal pulses.  Pulmonary/Chest: Effort normal and breath sounds normal. No respiratory distress.  Abdominal: Soft. Bowel sounds are normal. There is no tenderness.  Musculoskeletal: Normal range of motion. She exhibits no edema.  Neurological: She is alert and oriented to person, place, and time.  Skin: Skin is dry.  Psychiatric: She has a normal mood and affect.    EKG: personally reviewed my interpretation is sinus rhythm, no evidence of ST segment elevations or changes  CXR: personally reviewed my interpretation is negative for focal opacity or edema; no active cardiopulmonary disease  Assessment & Plan by Problem: Active Problems:   Chest pain  Chest pain Patient presenting with features of atypical and typical chest pain. Typical characteristics include substernal, pressure-like in quality with radiation to the neck and associated diaphoresis and SOB. Duration of symptoms and no exacerbating factors are atypical. Heart score of 4. EKG without evidence of acute ischemic changes, I-stat troponin negative x 2. CXR negative for active cardiopulmonary disease. Cardiopulmonary examination without focal findings.  -Trend troponin -Cardiac Monitoring  -Repeat EKG in AM -Hemoglobin A1C -Fasting lipid panel in AM  Hypertension BP elevated on admission 140/11. Improved, patient now normotensive.  -Continue HCTZ 12.5 mg daily   Asthma Lungs CTAB, no wheezes appreciated on auscultation.  -Continue QVAR 1 puff BID  Anxiety -Continue Celexa 10 mg daily  F: none E: replace as needed N: NPO at midnight (for any further cardiac testing)  Dispo: Admit patient to Observation with expected length of stay less than 2 midnights.  Signed: Toney RakesLacroce, Areen Trautner J, MD 09/15/2017, 3:41 PM    Pager: [phone]

## 2017-09-15 NOTE — Progress Notes (Signed)
Patient refused at this time, no distress noted RCP will continue to monitor.  

## 2017-09-15 NOTE — Telephone Encounter (Signed)
Pt called with elevated b/p, last night she was 146/100 and this morning 147/103. She denies headache but states there is pain behind her eyes, no chest pain but chest tightening. She is states she is anxious. Denies weakness or numbness. She is on b/p meds and have not missed any.  She is anxious, states her dad died at 3730 with a heart attack. And she is worried that this is what is going on with her. So is requesting an office visit to get a referral.   When trying to make an appointment with a provider, the patient states that she is sweaty and was sweaty last night. She is "sweating buckets right now".  Advise her to go to the emergency department right now because she could be having a heart attack.  Pt voiced understanding and she will get her husband to drive her to the emergency department.  Reason for Disposition . Systolic BP  >= 160 OR Diastolic >= 100  Answer Assessment - Initial Assessment Questions 1. BLOOD PRESSURE: "What is the blood pressure?" "Did you take at least two measurements 5 minutes apart?"     147/101 and 137/93 hr 92 2. ONSET: "When did you take your blood pressure?"     This morning 3. HOW: "How did you obtain the blood pressure?" (e.g., visiting nurse, automatic home BP monitor)     Wrist b/p monitor 4. HISTORY: "Do you have a history of high blood pressure?"     yes 5. MEDICATIONS: "Are you taking any medications for blood pressure?" "Have you missed any doses recently?"     Yes, not missed any doses 6. OTHER SYMPTOMS: "Do you have any symptoms?" (e.g., headache, chest pain, blurred vision, difficulty breathing, weakness)     Ears ringing, head feels weird, pain behind eyes, out of breath easily 7. PREGNANCY: "Is there any chance you are pregnant?" "When was your last menstrual period?"     No, LMP has an IUD  Protocols used: HIGH BLOOD PRESSURE-A-AH

## 2017-09-16 DIAGNOSIS — F329 Major depressive disorder, single episode, unspecified: Secondary | ICD-10-CM | POA: Diagnosis not present

## 2017-09-16 DIAGNOSIS — Z630 Problems in relationship with spouse or partner: Secondary | ICD-10-CM | POA: Diagnosis not present

## 2017-09-16 DIAGNOSIS — R0789 Other chest pain: Secondary | ICD-10-CM | POA: Diagnosis not present

## 2017-09-16 DIAGNOSIS — F419 Anxiety disorder, unspecified: Secondary | ICD-10-CM | POA: Diagnosis not present

## 2017-09-16 DIAGNOSIS — I10 Essential (primary) hypertension: Secondary | ICD-10-CM | POA: Diagnosis not present

## 2017-09-16 LAB — HEMOGLOBIN A1C
Hgb A1c MFr Bld: 5.7 % — ABNORMAL HIGH (ref 4.8–5.6)
Mean Plasma Glucose: 116.89 mg/dL

## 2017-09-16 LAB — RAPID URINE DRUG SCREEN, HOSP PERFORMED
Amphetamines: NOT DETECTED
BARBITURATES: NOT DETECTED
Benzodiazepines: NOT DETECTED
Cocaine: NOT DETECTED
Opiates: NOT DETECTED
Tetrahydrocannabinol: NOT DETECTED

## 2017-09-16 LAB — LIPID PANEL
CHOLESTEROL: 210 mg/dL — AB (ref 0–200)
HDL: 43 mg/dL (ref >40)
LDL Cholesterol: 138 mg/dL — ABNORMAL HIGH (ref 0–99)
TRIGLYCERIDES: 145 mg/dL (ref <150)
Total CHOL/HDL Ratio: 4.9 RATIO
VLDL: 29 mg/dL (ref 0–40)

## 2017-09-16 LAB — HIV ANTIBODY (ROUTINE TESTING W REFLEX): HIV SCREEN 4TH GENERATION: NONREACTIVE

## 2017-09-16 LAB — TROPONIN I

## 2017-09-16 MED ORDER — CITALOPRAM HYDROBROMIDE 20 MG PO TABS: 20.0000 mg | ORAL_TABLET | Freq: Every day | ORAL | 0 refills | Status: DC

## 2017-09-16 MED ORDER — ATORVASTATIN CALCIUM 40 MG PO TABS: 40.0000 mg | ORAL_TABLET | Freq: Every day | ORAL | 1 refills | Status: DC

## 2017-09-16 NOTE — Progress Notes (Signed)
Patient took her own medications at her regular administration time. [facility]. Patient took meds anyway.

## 2017-09-16 NOTE — Progress Notes (Signed)
Patient refuses pulmicort neb treatment. States she is using QVAR inhaler from home and does not need the treatment here.

## 2017-09-16 NOTE — Progress Notes (Addendum)
   Subjective:  Patient seen and examined. She states she has not experienced any chest pain since yesterday. After further discussion, she endorses symptoms of anxiety and depression that have been occurring due to marital troubles. She is seeing a counselor for her mental health. PCP is managing Celexa. Patient was recently changed to Celexa In February of 2019 because she was having recurrent diarrhea and thought to be due to medication.   Objective:  Vital signs in last 24 hours: Vitals:   09/15/17 1753 09/15/17 1940 09/15/17 2352 09/16/17 0612  BP: (!) 136/94 119/74 113/79 127/89  Pulse: 96 92 85 82  Resp: 14 16 18 18   Temp: 98 F (36.7 C) 98.3 F (36.8 C) 98.6 F (37 C) 98.4 F (36.9 C)  TempSrc:  Oral Oral Oral  SpO2: 100% 98% 96% 97%  Weight: 217 lb 6.4 oz (98.6 kg)   215 lb 9.6 oz (97.8 kg)  Height: 5\' 5"  (1.651 m)      General: Laying in bed comfortably, tearful HEENT: [place]/AT, EOMI, no scleral icterus, PERRL Cardiac: RRR, No R/M/G appreciated Pulm: normal effort, CTAB Abd: soft, non tender, non distended, BS normal Ext: extremities well perfused, no peripheral edema Neuro: alert and oriented X3, cranial nerves II-XII grossly intact   Assessment/Plan:  Active Problems:   Chest pain  Chest pain ACS ruled out with negative troponin trend and EKG without evidence of acute ischemic changes. Patient chest pain likely due to anxiety that is not being adequately treated by her low dose antidepressant. Hemoglobin A1c of 5.7, and Lipid panel with elevated LDL 138, total cholesterol 210. Current ASCVD 10 year risk score of 1.8%.  -will increase Celexa to 20 mg daily -Follow up with PCP in ~1 week   Hypertension Normotensive. -Continue HCTZ 12.5 mg daily   Asthma Lungs CTAB, no wheezes appreciated on auscultation.  -Continue QVAR 1 puff BID  Anxiety/Depression -Increase Celexa 20 mg daily -F/u with PCP and counselor in one week   F: none E: replace as  needed N: reg   Dispo: Anticipated discharge today.   Toney RakesLacroce, Travon Crochet J, MD 09/16/2017, 6:56 AM Pager: [phone]

## 2017-09-17 ENCOUNTER — Encounter: Payer: Self-pay | Admitting: Physician Assistant

## 2017-09-17 NOTE — Discharge Summary (Addendum)
Name: Wendy Shannon MRN: [id number] DOB: 15-Dec-1970 47 y.o. PCP: Magdalene RiverWiseman, Brittany D, PA-C  Date of Admission: 09/15/2017  1:15 PM Date of Discharge: 09/16/2017 Attending Physician: Cephas DarbyGranfortuna, James, MD  Discharge Diagnosis: 1. Chest pain related to anxiety  Active Problems:   Chest pain   Discharge Medications: Allergies as of 09/16/2017      Reactions   Ace Inhibitors Cough      Medication List    STOP taking these medications   hydrOXYzine 50 MG tablet Commonly known as:  ATARAX/VISTARIL     TAKE these medications   albuterol 108 (90 Base) MCG/ACT inhaler Commonly known as:  PROVENTIL HFA;VENTOLIN HFA Inhale 2 puffs into the lungs every 4 (four) hours as needed for wheezing or shortness of breath (cough, shortness of breath or wheezing.).   atorvastatin 40 MG tablet Commonly known as:  LIPITOR Take 1 tablet (40 mg total) by mouth daily.   beclomethasone 40 MCG/ACT inhaler Commonly known as:  QVAR REDIHALER Inhale 1 puff into the lungs 2 (two) times daily.   BELSOMRA 10 MG Tabs Generic drug:  Suvorexant TAKE 1 TABLET BY MOUTH 30 MINUTES BEFORE BEDTIME. DO MOT TAKE IF YOU CANNOT GET 7 HOURS OF SLEEP   citalopram 20 MG tablet Commonly known as:  CELEXA Take 1 tablet (20 mg total) by mouth daily. What changed:    medication strength  how much to take   diphenhydrAMINE 50 MG capsule Commonly known as:  BENADRYL Take 100 mg by mouth at bedtime.   diphenoxylate-atropine 2.5-0.025 MG tablet Commonly known as:  LOMOTIL Take 1 tablet by mouth 4 (four) times daily as needed for diarrhea or loose stools.   hydrochlorothiazide 12.5 MG capsule Commonly known as:  MICROZIDE Take 1 capsule (12.5 mg total) by mouth daily.   montelukast 10 MG tablet Commonly known as:  SINGULAIR TAKE 1 TABLET (10 MG TOTAL) BY MOUTH DAILY.       Disposition and follow-up:   [facility]Wendy [facility].  At the  hospital follow up visit please address:  1.   Chest pain -ACS ruled out -Negative Troponin x3 -EKG without evidence of ST segment abnormalities or ischemic changes -May benefit from exercise stress testing if chest pain continues to occur given patient's family history and risk factors  Depression and Anxiety -Does not seem well controlled on current dose of Celexa 10 mg -Chest pain likely related to anxiety, also experiencing some depressive symptoms please address her mood and response to increase in Celexa 20 mg daily   2.  Labs / imaging needed at time of follow-up: none   3.  Pending labs/ test needing follow-up: none   Follow-up Appointments: Follow-up Information    Magdalene RiverWiseman, Brittany D, PA-C. Schedule an appointment as soon as possible for a visit on 09/30/2017.   Specialties:  Advice workerhysician Assistant, Family Medicine Why:  At 9:00 AM. Contact information: [address] [place] [address] [phone]           Hospital Course by problem list: Active Problems:   Chest pain   1. Chest Pain, Hypertension Ms. Wendy [facility]  and the Internal Medicine Teaching Service for chest pain and acute coronary syndrome rule out. [facility] the patient was hemodynamically stable. EKG was without ST segment changes concerning for acute ischemia. I-stat troponin x 2 in the emergency department were negative. Chest xray negative for active cardiopulmonary process. Serial troponin level  was trended overnight and was negative x 3. Repeat EKG showed no changes of acute ischemia. The patient's blood pressure was normotensive during hospitalization and she was continued on home HCTZ. Patient was started on Lipitor 40 mg daily.   2. Anxiety, Depression The patient admitted to having marital problems for approximately one year duration, which has been the source of her anxiety and depression. She was discharged on an increase dose of Celexa and given instructions to  follow up with her primary care provider and counselor to discuss the change in her medication.   3. Asthma The patient's respiratory status remained stable. Home medications were continued during hospitalization and at discharge.   Discharge Vitals:   BP 121/85 (BP Location: Left Arm)   Pulse 84   Temp 98.4 F (36.9 C) (Oral)   Resp 18   Ht 5\' 5"  (1.651 m)   Wt 215 lb 9.6 oz (97.8 kg)   SpO2 97%   BMI 35.88 kg/m   Pertinent Labs, Studies, and Procedures:  BMP Latest Ref Rng & Units 09/15/2017 09/16/2016 03/18/2016  Glucose 65 - 99 mg/dL 161(W) 90 91  BUN 6 - 20 mg/dL 6 11 8   Creatinine 0.44 - 1.00 mg/dL 9.60 4.54 0.98  BUN/Creat Ratio 9 - 23 - 14 -  Sodium 135 - 145 mmol/L 138 141 138  Potassium 3.5 - 5.1 mmol/L 4.3 4.1 4.3  Chloride 101 - 111 mmol/L 102 102 102  CO2 22 - 32 mmol/L 26 23 25   Calcium 8.9 - 10.3 mg/dL 9.6 9.2 9.8   CBC Latest Ref Rng & Units 09/15/2017 07/13/2017 09/16/2016  WBC 4.0 - 10.5 K/uL 10.4 9.1 6.9  Hemoglobin 12.0 - 15.0 g/dL 15.6(H) 14.3 13.2  Hematocrit 36.0 - 46.0 % 47.0(H) 42.9 39.4  Platelets 150 - 400 K/uL 389 - 316   Hemoglobin A1C  5.7  Lipid Panel     Component Value Date/Time   CHOL 210 (H) 09/16/2017 0353   CHOL 170 09/16/2016 0938   TRIG 145 09/16/2017 0353   HDL 43 09/16/2017 0353   HDL 51 09/16/2016 0938   CHOLHDL 4.9 09/16/2017 0353   VLDL 29 09/16/2017 0353   LDLCALC 138 (H) 09/16/2017 0353   LDLCALC 106 (H) 09/16/2016 0938   Chest Xray FINDINGS: The heart size and mediastinal contours are within normal limits. Both lungs are clear. The visualized skeletal structures are unremarkable. IMPRESSION: No active cardiopulmonary disease.  Discharge Instructions: Discharge Instructions    Call MD for:  difficulty breathing, headache or visual disturbances   Complete by:  As directed    Call MD for:  persistant dizziness or light-headedness   Complete by:  As directed    Call MD for:  persistant nausea and vomiting   Complete  by:  As directed    Call MD for:  severe uncontrolled pain   Complete by:  As directed    Diet - low sodium heart healthy   Complete by:  As directed    Discharge instructions   Complete by:  As directed    Ms. Wendy Shannon,   You were admitted for chest pain evaluation. You did not have a heart attack. It seems like your chest pain is due to anxiety. I have increased your Celexa to 20 mg daily. I have sent this prescription to your pharmacy.   I would recommend following up with your primary care provider and counselor in approximately one week to discuss the increase in your Celexa, as well as this  hospitalization.   Continue you asthma and blood pressure medications as you were taking prior to hospitalization. No changes were made to those medications.   Increase activity slowly   Complete by:  As directed       Signed: Toney Rakes, MD 09/17/2017, 11:52 AM   Pager: [phone]

## 2017-09-18 NOTE — Telephone Encounter (Signed)
Copied from CRM [id number]. Topic: Quick Communication - See Telephone Encounter >> Sep 18, 2017  4:07 PM Rudi CocoLathan, Myana Schlup M, NT wrote: CRM for notification. See Telephone encounter for:   09/18/17. Pt. Calling to speak with Barnett AbuWiseman or nurse about Lipitor already picked it up from pharmacy but is hesitated about taking med. Would like to speak with [place] Wiseman before she takes med.[phone]

## 2017-09-22 NOTE — Telephone Encounter (Signed)
Patient called back about wanting to see if she should take atorvastatin (LIPITOR) 40 MG tablet and would like a call back.

## 2017-09-30 ENCOUNTER — Encounter: Payer: Self-pay | Admitting: Physician Assistant

## 2017-09-30 ENCOUNTER — Other Ambulatory Visit: Payer: Self-pay

## 2017-09-30 ENCOUNTER — Ambulatory Visit: Payer: BC Managed Care – PPO | Admitting: Physician Assistant

## 2017-09-30 VITALS — BP 129/88 | HR 92 | Temp 99.5°F | Resp 16 | Ht 65.0 in | Wt 215.4 lb

## 2017-09-30 DIAGNOSIS — F419 Anxiety disorder, unspecified: Secondary | ICD-10-CM

## 2017-09-30 DIAGNOSIS — F32A Depression, unspecified: Secondary | ICD-10-CM

## 2017-09-30 DIAGNOSIS — R197 Diarrhea, unspecified: Secondary | ICD-10-CM | POA: Diagnosis not present

## 2017-09-30 DIAGNOSIS — Z09 Encounter for follow-up examination after completed treatment for conditions other than malignant neoplasm: Secondary | ICD-10-CM

## 2017-09-30 DIAGNOSIS — F329 Major depressive disorder, single episode, unspecified: Secondary | ICD-10-CM | POA: Diagnosis not present

## 2017-09-30 MED ORDER — HYDROXYZINE HCL 25 MG PO TABS: 12.5000 mg | ORAL_TABLET | Freq: Three times a day (TID) | ORAL | 0 refills | Status: DC | PRN

## 2017-09-30 NOTE — Patient Instructions (Addendum)
I am glad to see that you are feeling better.  Continue with Celexa 20 mg daily.  I have also given a prescription for hydroxyzine to use as needed for anxiety.  Keep in mind that this can make you drowsy.  Follow-up in 4 weeks for reevaluation or sooner if symptoms worsen or you develop new concerning symptoms.    IF you received an x-ray today, you will receive an invoice from [facility]. Please contact [facility] at [phone] with questions or concerns regarding your invoice.   IF you received labwork today, you will receive an invoice from [facility]. Please contact [facility] at [phone] with questions or concerns regarding your invoice.   Our billing staff will not be able to assist you with questions regarding bills from these companies.  You will be contacted with the lab results as soon as they are available. The fastest way to get your results is to activate your My Chart account. Instructions are located on the last page of this paperwork. If you have not heard from us regarding the results in 2 weeks, please contact this office.

## 2017-09-30 NOTE — Progress Notes (Signed)
Wendy Shannon  MRN: [id number] DOB: October 07, 1970  Subjective:  Wendy Shannon is a 47 y.o. female seen in office today for a chief complaint of hospital follow-up.  Patient was admitted on 09/15/17  for chest pain and discharged on 09/16/17.  ACS was ruled out.  It was determined that chest pain was likely related to uncontrolled anxiety.  Per review of discharge summary, it was noted that patient may benefit from exercise testing if chest pain continues to occur given patient's family history and risk factors.  She was encouraged to follow-up with PCP to discuss increase in antidepressant medication and counseling.  Please see  discharge summary for additional details.  In terms of anxiety, on 09/02/17, we switched pt's  from Lexapro to Celexa due to patient having frequent episodes of diarrhea.  Celexa started at low dose of 10 mg.  She had mycharted me after her admission and we had agreed to increase Celexa to 20 mg daily.  Today, she notes she is doing much better.  That her anxiety is much better controlled on the 20 mg compared to the 10 mg.  Her diarrhea has completely resolved.    She will still have moments of increased irritability during times that used to not cause irritability.  However, she has not had full-blown panic attacks since her ED visit.  Today, she denies chest pain, shortness of breath, wheezing, diaphoresis, nausea, vomiting, lower leg swelling, and abdominal pain.  Review of Systems  Constitutional: Negative for chills and fever.  Respiratory: Negative for chest tightness.   Cardiovascular: Negative for palpitations.  Neurological: Negative for dizziness and light-headedness.  Psychiatric/Behavioral: Negative for self-injury and suicidal ideas.      Patient Active Problem List   Diagnosis Date Noted  . Chest pain 09/15/2017  . GERD (gastroesophageal reflux disease) 12/25/2014  . Obesity 12/25/2014  . Mild persistent asthma in adult without complication  12/25/2014  . Family history of coronary artery disease 12/25/2014  . Allergic rhinitis due to pollen 12/25/2014  . Family history of premature CAD 12/25/2014    Current Outpatient Medications on File Prior to Visit  Medication Sig Dispense Refill  . albuterol (PROVENTIL HFA;VENTOLIN HFA) 108 (90 Base) MCG/ACT inhaler Inhale 2 puffs into the lungs every 4 (four) hours as needed for wheezing or shortness of breath (cough, shortness of breath or wheezing.). 1 Inhaler 1  . beclomethasone (QVAR REDIHALER) 40 MCG/ACT inhaler Inhale 1 puff into the lungs 2 (two) times daily. 10.6 g 0  . citalopram (CELEXA) 20 MG tablet Take 1 tablet (20 mg total) by mouth daily. 30 tablet 0  . diphenoxylate-atropine (LOMOTIL) 2.5-0.025 MG tablet Take 1 tablet by mouth 4 (four) times daily as needed for diarrhea or loose stools. 30 tablet 0  . hydrochlorothiazide (MICROZIDE) 12.5 MG capsule Take 1 capsule (12.5 mg total) by mouth daily. 90 capsule 1  . Ipratropium-Albuterol (COMBIVENT IN) Inhale into the lungs.    . montelukast (SINGULAIR) 10 MG tablet TAKE 1 TABLET (10 MG TOTAL) BY MOUTH DAILY. 30 tablet 6  . atorvastatin (LIPITOR) 40 MG tablet Take 1 tablet (40 mg total) by mouth daily. (Patient not taking: Reported on 09/30/2017) 30 tablet 1  . BELSOMRA 10 MG TABS TAKE 1 TABLET BY MOUTH 30 MINUTES BEFORE BEDTIME. DO MOT TAKE IF YOU CANNOT GET 7 HOURS OF SLEEP (Patient not taking: Reported on 09/30/2017) 90 tablet 1  . diphenhydrAMINE (BENADRYL) 50 MG capsule Take 100 mg by mouth at bedtime.  No current facility-administered medications on file prior to visit.     Allergies  Allergen Reactions  . Ace Inhibitors Cough      Social History   Socioeconomic History  . Marital status: Married    Spouse name: Acupuncturistcott  . Number of children: 4  . Years of education: Not on file  . Highest education level: Not on file  Social Needs  . Financial resource strain: Not on file  . Food insecurity - worry: Not on file   . Food insecurity - inability: Not on file  . Transportation needs - medical: Not on file  . Transportation needs - non-medical: Not on file  Occupational History  . Not on file  Tobacco Use  . Smoking status: Never Smoker  . Smokeless tobacco: Never Used  Substance and Sexual Activity  . Alcohol use: No  . Drug use: No  . Sexual activity: Yes  Other Topics Concern  . Not on file  Social History Narrative  . Not on file    Objective:  BP 129/88 (BP Location: Right Arm, Patient Position: Sitting, Cuff Size: Large)   Pulse 92   Temp 99.5 F (37.5 C) (Oral)   Resp 16   Ht 5\' 5"  (1.651 m)   Wt 215 lb 6.4 oz (97.7 kg)   SpO2 97%   BMI 35.84 kg/m   Physical Exam  Constitutional: She is oriented to person, place, and time and well-developed, well-nourished, and in no distress.  HENT:  Head: Normocephalic and atraumatic.  Eyes: Conjunctivae are normal.  Neck: Normal range of motion.  Cardiovascular: Normal rate and normal heart sounds.  Pulmonary/Chest: Effort normal and breath sounds normal. She has no wheezes. She has no rales.  Neurological: She is alert and oriented to person, place, and time. Gait normal.  Skin: Skin is warm and dry.  Psychiatric: Affect normal.  Vitals reviewed.   Assessment and Plan :  1. Anxiety and depression Patient is doing much better with increase in Celexa.  Will attempt trial of hydroxyzine at this time for moments of increased anxiety and irritability at work.  Plan to follow-up in 4 weeks for reevaluation.  If no improvement with hydroxyzine, consider benzo at that time. - hydrOXYzine (ATARAX/VISTARIL) 25 MG tablet; Take 0.5-1 tablets (12.5-25 mg total) by mouth every 8 (eight) hours as needed for anxiety.  Dispense: 30 tablet; Refill: 0  2. Diarrhea, unspecified type Resolved  3. Hospital discharge follow-up I personally reviewed hospital discharge notes. Patient is doing much better since hospital discharge.  She is asymptomatic  today.  Continue with Celexa 20 mg daily.  Benjiman CoreBrittany Wiseman PA-C  [facility] at [facility]  [facility] 09/30/2017 9:19 AM

## 2017-10-06 ENCOUNTER — Other Ambulatory Visit: Payer: Self-pay | Admitting: Physician Assistant

## 2017-10-06 DIAGNOSIS — R05 Cough: Secondary | ICD-10-CM

## 2017-10-06 DIAGNOSIS — I10 Essential (primary) hypertension: Secondary | ICD-10-CM

## 2017-10-06 DIAGNOSIS — R059 Cough, unspecified: Secondary | ICD-10-CM

## 2017-10-09 ENCOUNTER — Other Ambulatory Visit: Payer: Self-pay | Admitting: Internal Medicine

## 2017-10-15 ENCOUNTER — Other Ambulatory Visit: Payer: Self-pay | Admitting: Internal Medicine

## 2017-10-29 ENCOUNTER — Ambulatory Visit: Payer: BC Managed Care – PPO | Admitting: Physician Assistant

## 2017-10-29 ENCOUNTER — Other Ambulatory Visit: Payer: Self-pay | Admitting: Physician Assistant

## 2017-11-03 ENCOUNTER — Other Ambulatory Visit: Payer: Self-pay | Admitting: Internal Medicine

## 2017-11-04 ENCOUNTER — Other Ambulatory Visit: Payer: Self-pay | Admitting: Internal Medicine

## 2017-11-05 ENCOUNTER — Other Ambulatory Visit: Payer: Self-pay

## 2017-11-05 ENCOUNTER — Encounter: Payer: Self-pay | Admitting: Physician Assistant

## 2017-11-05 ENCOUNTER — Ambulatory Visit: Payer: BC Managed Care – PPO | Admitting: Physician Assistant

## 2017-11-05 DIAGNOSIS — F419 Anxiety disorder, unspecified: Secondary | ICD-10-CM | POA: Diagnosis not present

## 2017-11-05 DIAGNOSIS — J4530 Mild persistent asthma, uncomplicated: Secondary | ICD-10-CM | POA: Diagnosis not present

## 2017-11-05 DIAGNOSIS — F329 Major depressive disorder, single episode, unspecified: Secondary | ICD-10-CM | POA: Diagnosis not present

## 2017-11-05 DIAGNOSIS — I10 Essential (primary) hypertension: Secondary | ICD-10-CM | POA: Diagnosis not present

## 2017-11-05 DIAGNOSIS — F32A Depression, unspecified: Secondary | ICD-10-CM

## 2017-11-05 MED ORDER — HYDROXYZINE HCL 25 MG PO TABS: 12.5000 mg | ORAL_TABLET | Freq: Three times a day (TID) | ORAL | 0 refills | Status: DC | PRN

## 2017-11-05 MED ORDER — MONTELUKAST SODIUM 10 MG PO TABS: 10.0000 mg | ORAL_TABLET | Freq: Every day | ORAL | 1 refills | Status: DC

## 2017-11-05 MED ORDER — BECLOMETHASONE DIPROP HFA 40 MCG/ACT IN AERB: INHALATION_SPRAY | RESPIRATORY_TRACT | 1 refills | Status: AC

## 2017-11-05 MED ORDER — CITALOPRAM HYDROBROMIDE 20 MG PO TABS: 20.0000 mg | ORAL_TABLET | Freq: Every day | ORAL | 1 refills | Status: DC

## 2017-11-05 MED ORDER — HYDROCHLOROTHIAZIDE 12.5 MG PO CAPS: ORAL_CAPSULE | ORAL | 1 refills | Status: DC

## 2017-11-05 NOTE — Progress Notes (Signed)
Wendy HawkingRebecca H Lokken  MRN: [id number] DOB: Jul 14, 1971  Subjective:  Wendy Shannon is a 47 y.o. female seen in office today for a chief complaint of follow-up on anxiety, depression, asthma, and hypertension.  Anxiety and depression: Patient is now taking Celexa 20 mg daily.  Notes that this is working very well.  Feels like her anxiety and depression are finally well controlled. 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  She is attending counseling consistently.  Wishes her husband would start attending marriage counseling.  Since changing from Lexapro to Celexa, her diarrhea resolved.  Hydroxyzine as needed for panic attacks.  Denies increased bouts of crying, sleep disturbance, suicidal thoughts, homicidal thoughts, nausea, vomiting,  and diarrhea.  Asthma:Using Qvar twice daily.  Will use Combivent inhaler as rescue inhaler.  Has not had any issues since she started using the Qvar consistently.  Denies wheezing, shortness of breath, chest pain, nighttime awakenings, and cough.  Feels very well controlled at this time.  HTN: Currently managed with hydrochlorothiazide 12.5 mg.  Denies lightheadedness, dizziness, chronic headache, double vision, chest pain, shortness of breath, heart racing, palpitations, nausea, vomiting, abdominal pain, hematuria, and lower leg splint.   Review of Systems  Per HPI  Patient Active Problem List   Diagnosis Date Noted  . Chest pain 09/15/2017  . GERD (gastroesophageal reflux disease) 12/25/2014  . Obesity 12/25/2014  . Mild persistent asthma in adult without complication 12/25/2014  . Family history of coronary artery disease 12/25/2014  . Allergic rhinitis due to pollen 12/25/2014  . Family history of premature CAD 12/25/2014    Current Outpatient Medications on File Prior to Visit  Medication Sig Dispense Refill  . albuterol (PROVENTIL HFA;VENTOLIN HFA) 108 (90 Base) MCG/ACT inhaler Inhale 2  puffs into the lungs every 4 (four) hours as needed for wheezing or shortness of breath (cough, shortness of breath or wheezing.). (Patient not taking: Reported on 11/05/2017) 1 Inhaler 1  . diphenoxylate-atropine (LOMOTIL) 2.5-0.025 MG tablet Take 1 tablet by mouth 4 (four) times daily as needed for diarrhea or loose stools. 30 tablet 0  . Ipratropium-Albuterol (COMBIVENT IN) Inhale into the lungs.     No current facility-administered medications on file prior to visit.     Allergies  Allergen Reactions  . Ace Inhibitors Cough   Social History   Socioeconomic History  . Marital status: Married    Spouse name: Acupuncturistcott  . Number of children: 4  . Years of education: Not on file  . Highest education level: Not on file  Occupational History  . Not on file  Social Needs  . Financial resource strain: Not on file  . Food insecurity:    Worry: Not on file    Inability: Not on file  . Transportation needs:    Medical: Not on file    Non-medical: Not on file  Tobacco Use  . Smoking status: Never Smoker  . Smokeless tobacco: Never Used  Substance and Sexual Activity  . Alcohol use: Yes    Comment: occ  . Drug use: No  . Sexual activity: Yes  Lifestyle  . Physical activity:    Days per week: Not on file    Minutes per session: Not on file  . Stress: Not on file  Relationships  . Social connections:    Talks on phone: Not on file    Gets together: Not on file    Attends religious service: Not on  file    Active member of club or organization: Not on file    Attends meetings of clubs or organizations: Not on file    Relationship status: Not on file  . Intimate partner violence:    Fear of current or ex partner: Not on file    Emotionally abused: Not on file    Physically abused: Not on file    Forced sexual activity: Not on file  Other Topics Concern  . Not on file  Social History Narrative  . Not on file      Objective:  BP 134/88 (BP Location: Right Arm, Patient  Position: Sitting, Cuff Size: Large)   Pulse 87   Temp 98.8 F (37.1 C) (Oral)   Resp 18   Ht 5' 5.63" (1.667 m)   Wt 216 lb (98 kg)   SpO2 97%   BMI 35.26 kg/m    Physical Exam  Constitutional: She is oriented to person, place, and time. She appears well-developed and well-nourished. No distress.  HENT:  Head: Normocephalic and atraumatic.  Eyes: Conjunctivae are normal.  Neck: Normal range of motion.  Cardiovascular: Normal rate, regular rhythm and normal heart sounds.  Pulmonary/Chest: Effort normal and breath sounds normal. She has no decreased breath sounds. She has no wheezes. She has no rhonchi. She has no rales.  Musculoskeletal:       Right lower leg: She exhibits no swelling.       Left lower leg: She exhibits no swelling.  Neurological: She is alert and oriented to person, place, and time.  Skin: Skin is warm and dry.  Psychiatric: She has a normal mood and affect.  Happy and smiling during visit.   Vitals reviewed.   BP Readings from Last 3 Encounters:  11/05/17 134/88  09/30/17 129/88  09/16/17 121/85   Assessment and Plan :  1. Anxiety and depression Well-controlled at this time.  Continue current medication regimen.  Recommended patient continue counseling.  Follow-up in 3 months for reevaluation. Will repeat GAD and PHQ9 at that visit.  - hydrOXYzine (ATARAX/VISTARIL) 25 MG tablet; Take 0.5-1 tablets (12.5-25 mg total) by mouth every 8 (eight) hours as needed for anxiety.  Dispense: 30 tablet; Refill: 0 - citalopram (CELEXA) 20 MG tablet; Take 1 tablet (20 mg total) by mouth daily.  Dispense: 90 tablet; Refill: 1  2. Mild persistent asthma without complication -Well controlled at this time. Continue with medication regimen. - montelukast (SINGULAIR) 10 MG tablet; Take 1 tablet (10 mg total) by mouth daily.  Dispense: 90 tablet; Refill: 1 - beclomethasone (QVAR REDIHALER) 40 MCG/ACT inhaler; TAKE 1 PUFF BY MOUTH TWICE A DAY  Dispense: 10.6 g; Refill: 1  3.  Essential hypertension -Controlled.  Continue with medication regimen. Will obtain lab work at follow-up visit in 3 months. - hydrochlorothiazide (MICROZIDE) 12.5 MG capsule; TAKE 1 CAPSULE BY MOUTH EVERY DAY  Dispense: 90 capsule; Refill: 1   Benjiman Core PA-C  Primary Care at [facility] 11/10/2017 11:34 AM

## 2017-11-05 NOTE — Patient Instructions (Addendum)
  Follow up in 3 months. Thank you for letting me participate in your health and well being.  IF you received an x-ray today, you will receive an invoice from [facility]. Please contact [facility] at [phone] with questions or concerns regarding your invoice.   IF you received labwork today, you will receive an invoice from [facility]. Please contact [facility] at [phone] with questions or concerns regarding your invoice.   Our billing staff will not be able to assist you with questions regarding bills from these companies.  You will be contacted with the lab results as soon as they are available. The fastest way to get your results is to activate your My Chart account. Instructions are located on the last page of this paperwork. If you have not heard from us regarding the results in 2 weeks, please contact this office.

## 2017-11-10 ENCOUNTER — Encounter: Payer: Self-pay | Admitting: Physician Assistant

## 2017-11-23 ENCOUNTER — Other Ambulatory Visit: Payer: Self-pay | Admitting: Physician Assistant

## 2017-11-23 DIAGNOSIS — F32A Depression, unspecified: Secondary | ICD-10-CM

## 2017-11-23 DIAGNOSIS — F419 Anxiety disorder, unspecified: Principal | ICD-10-CM

## 2017-11-23 DIAGNOSIS — F329 Major depressive disorder, single episode, unspecified: Secondary | ICD-10-CM

## 2017-11-23 NOTE — Telephone Encounter (Signed)
Refill request for celexa / Last filled:  Disp Refills Start End   citalopram (CELEXA) 20 MG tablet 90 tablet 1 11/05/2017     / Refill not appropriate.

## 2018-01-02 ENCOUNTER — Other Ambulatory Visit: Payer: Self-pay | Admitting: Physician Assistant

## 2018-01-12 ENCOUNTER — Other Ambulatory Visit: Payer: Self-pay | Admitting: Physician Assistant

## 2018-01-12 DIAGNOSIS — J4521 Mild intermittent asthma with (acute) exacerbation: Secondary | ICD-10-CM

## 2018-01-12 NOTE — Telephone Encounter (Signed)
Combivent Respimat 20-100 mcg refill request  LOV 11/05/17 with Barnett AbuWiseman.    Unable to tell if she is still on this or not.    During OV on 09/02/17 with Barnett AbuWiseman this was discontinued.   However in OV notes on 11/05/17 it's mentioned as still being used as a rescue inhaler.  [facility] [address]

## 2018-01-12 NOTE — Telephone Encounter (Signed)
Please advise/refill Combivent Respimat 20-100 mcg refill request

## 2018-02-08 ENCOUNTER — Other Ambulatory Visit: Payer: Self-pay | Admitting: Physician Assistant

## 2018-02-08 DIAGNOSIS — J4521 Mild intermittent asthma with (acute) exacerbation: Secondary | ICD-10-CM

## 2018-02-09 NOTE — Telephone Encounter (Signed)
combivent respimat refill Last Refill:01/16/18 # 4g with 1 RF Last OV: 11/05/17 PCP: Benjiman CoreBrittany Wiseman PA [facility] [id number] Pt is due for a F/U by 02/04/18  Called and left message for pt to call back to make appt

## 2018-02-09 NOTE — Telephone Encounter (Signed)
Please advise refill? 

## 2018-02-10 ENCOUNTER — Ambulatory Visit: Payer: BC Managed Care – PPO | Admitting: Physician Assistant

## 2018-02-10 ENCOUNTER — Encounter: Payer: Self-pay | Admitting: Physician Assistant

## 2018-02-10 ENCOUNTER — Other Ambulatory Visit: Payer: Self-pay

## 2018-02-10 VITALS — BP 139/93 | HR 89 | Temp 99.9°F | Resp 20 | Ht 65.08 in | Wt 218.6 lb

## 2018-02-10 DIAGNOSIS — J0100 Acute maxillary sinusitis, unspecified: Secondary | ICD-10-CM | POA: Diagnosis not present

## 2018-02-10 MED ORDER — AMOXICILLIN-POT CLAVULANATE 875-125 MG PO TABS: 1.0000 | ORAL_TABLET | Freq: Two times a day (BID) | ORAL | 0 refills | Status: AC

## 2018-02-10 MED ORDER — PREDNISONE 20 MG PO TABS: 40.0000 mg | ORAL_TABLET | Freq: Every day | ORAL | 0 refills | Status: AC

## 2018-02-10 NOTE — Progress Notes (Signed)
MRN: [id number] DOB: 03-Dec-1970  Subjective:   Wendy Shannon is a 47 y.o. female presenting for chief complaint of Sinus Problem (X 2 mth with left side ear fullness) .  Reports 2 month history of left sided sinus pressure/pain. Sometimes pain radiates to teeth. Has associated left ear fullness and nasal congestion (worse on left side). Can feel drainage from one sinus cavity to another when she lies down. Has tried zyrtec, neti pot, and sudfed prn with no full relief.  Denies confusion, visual disturbance, fever, sore throat, wheezing, chest pain and cough, night sweats, chills, nausea, vomiting, abdominal pain and diarrhea. Has not had sick contact with anyone. Has history of seasonal allergies and history of asthma. Denies smoking. Denies any other aggravating or relieving factors, no other questions or concerns.  Wendy Shannon has a current medication list which includes the following prescription(s): beclomethasone, citalopram, combivent respimat, diphenoxylate-atropine, hydrochlorothiazide, hydroxyzine, ipratropium-albuterol, montelukast, amoxicillin-clavulanate, and prednisone. Also is allergic to ace inhibitors.  Wendy Shannon  has a past medical history of Asthma, History of chicken pox, and Hypertension. Also  has a past surgical history that includes Tonsillectomy; Breast surgery (Bilateral, 2007); and Tonsillectomy and adenoidectomy (1984).   Objective:   Vitals: BP (!) 139/93 (BP Location: Left Arm, Patient Position: Sitting, Cuff Size: Large)   Pulse 89   Temp 99.9 F (37.7 C) (Oral)   Resp 20   Ht 5' 5.08" (1.653 m)   Wt 218 lb 9.6 oz (99.2 kg)   SpO2 96%   BMI 36.29 kg/m   Physical Exam  Constitutional: She is oriented to person, place, and time. She appears well-developed and well-nourished. No distress.  HENT:  Head: Normocephalic and atraumatic.  Right Ear: Tympanic membrane, external ear and ear canal normal.  Left Ear: Tympanic membrane, external ear and ear canal  normal.  Nose: Mucosal edema (mild right, mod-severe left) present. Right sinus exhibits no maxillary sinus tenderness and no frontal sinus tenderness. Left sinus exhibits maxillary sinus tenderness (with applied pressure and leaning head foward). Left sinus exhibits no frontal sinus tenderness.  Mouth/Throat: Uvula is midline and mucous membranes are normal. No posterior oropharyngeal edema, posterior oropharyngeal erythema or tonsillar abscesses. No tonsillar exudate.  Eyes: Pupils are equal, round, and reactive to light. Conjunctivae and EOM are normal.  Neck: Normal range of motion.  Cardiovascular: Normal rate, regular rhythm, normal heart sounds and intact distal pulses.  Pulmonary/Chest: Effort normal and breath sounds normal. She has no decreased breath sounds. She has no wheezes. She has no rhonchi. She has no rales.  Lymphadenopathy:       Head (right side): No submental, no submandibular, no tonsillar, no preauricular, no posterior auricular and no occipital adenopathy present.       Head (left side): No submental, no submandibular, no tonsillar, no preauricular, no posterior auricular and no occipital adenopathy present.    She has no cervical adenopathy.       Right: No supraclavicular adenopathy present.       Left: No supraclavicular adenopathy present.  Neurological: She is alert and oriented to person, place, and time.  Skin: Skin is warm and dry.  Psychiatric: She has a normal mood and affect.  Vitals reviewed.   No results found for this or any previous visit (from the past 24 hour(s)).  Assessment and Plan :  1. Acute maxillary sinusitis, recurrence not specified Due to duration and severity of mucosal edema on affected side with no improvement with OTC medication, rec both  short course oral steroids and abx at this time. Continue neti pot and zyrtec daily. Advised to return to clinic if symptoms worsen, do not improve, or as needed. - predniSONE (DELTASONE) 20 MG tablet;  Take 2 tablets (40 mg total) by mouth daily with breakfast for 3 days.  Dispense: 6 tablet; Refill: 0 - amoxicillin-clavulanate (AUGMENTIN) 875-125 MG tablet; Take 1 tablet by mouth 2 (two) times daily for 7 days.  Dispense: 14 tablet; Refill: 0  Benjiman CoreBrittany Michole Lecuyer, PA-C  Primary Care at [facility] 02/10/2018 5:15 PM

## 2018-02-10 NOTE — Patient Instructions (Addendum)
Your symptoms are consistent with sinusitis. Due to duration and severity, we will treat with antibiotic and prednisone. Continue with neti pot and zyrtec. Prednisone is a steroid and can cause side effects such as headache, irritability, nausea, vomiting, increased heart rate, increased blood pressure, increased blood sugar, appetite changes, and insomnia. Please take tablets in the morning with a full meal to help decrease the chances of these side effects.    Return to clinic if symptoms worsen, do not improve, or as needed    Sinusitis, Adult Sinusitis is soreness and inflammation of your sinuses. Sinuses are hollow spaces in the bones around your face. They are located:  Around your eyes.  In the middle of your forehead.  Behind your nose.  In your cheekbones.  Your sinuses and nasal passages are lined with a stringy fluid (mucus). Mucus normally drains out of your sinuses. When your nasal tissues get inflamed or swollen, the mucus can get trapped or blocked so air cannot flow through your sinuses. This lets bacteria, viruses, and funguses grow, and that leads to infection. Follow these instructions at home: Medicines  Take, use, or apply over-the-counter and prescription medicines only as told by your doctor. These may include nasal sprays.  If you were prescribed an antibiotic medicine, take it as told by your doctor. Do not stop taking the antibiotic even if you start to feel better. Hydrate and Humidify  Drink enough water to keep your pee (urine) clear or pale yellow.  Use a cool mist humidifier to keep the humidity level in your home above 50%.  Breathe in steam for 10-15 minutes, 3-4 times a day or as told by your doctor. You can do this in the bathroom while a hot shower is running.  Try not to spend time in cool or dry air. Rest  Rest as much as possible.  Sleep with your head raised (elevated).  Make sure to get enough sleep each night. General  instructions  Put a warm, moist washcloth on your face 3-4 times a day or as told by your doctor. This will help with discomfort.  Wash your hands often with soap and water. If there is no soap and water, use hand sanitizer.  Do not smoke. Avoid being around people who are smoking (secondhand smoke).  Keep all follow-up visits as told by your doctor. This is important. Contact a doctor if:  You have a fever.  Your symptoms get worse.  Your symptoms do not get better within 10 days. Get help right away if:  You have a very bad headache.  You cannot stop throwing up (vomiting).  You have pain or swelling around your face or eyes.  You have trouble seeing.  You feel confused.  Your neck is stiff.  You have trouble breathing. This information is not intended to replace advice given to you by your health care provider. Make sure you discuss any questions you have with your health care provider. Document Released: 12/17/2007 Document Revised: 02/24/2016 Document Reviewed: 04/25/2015 Elsevier Interactive Patient Education  2018 [employer]     IF you received an x-ray today, you will receive an invoice from [facility]. Please contact [facility] at [phone] with questions or concerns regarding your invoice.   IF you received labwork today, you will receive an invoice from [facility]. Please contact [facility] at [phone] with questions or concerns regarding your invoice.   Our billing staff will not be able to assist you with questions regarding bills from  these companies.  You will be contacted with the lab results as soon as they are available. The fastest way to get your results is to activate your My Chart account. Instructions are located on the last page of this paperwork. If you have not heard from [place] regarding the results in 2 weeks, please contact this office.

## 2018-03-26 ENCOUNTER — Other Ambulatory Visit: Payer: Self-pay | Admitting: Physician Assistant

## 2018-03-26 DIAGNOSIS — J4521 Mild intermittent asthma with (acute) exacerbation: Secondary | ICD-10-CM

## 2018-03-26 NOTE — Telephone Encounter (Signed)
combivent respimat 20-100 mcg refill Last Refill:02/10/18 # 4g 1 RF Last OV: 02/10/18 PCP: Benjiman CoreBrittany Wiseman PA [facility] [address]

## 2018-03-30 ENCOUNTER — Encounter: Payer: Self-pay | Admitting: Physician Assistant

## 2018-03-30 ENCOUNTER — Ambulatory Visit: Payer: BC Managed Care – PPO | Admitting: Physician Assistant

## 2018-03-30 ENCOUNTER — Telehealth: Payer: Self-pay | Admitting: Physician Assistant

## 2018-03-30 ENCOUNTER — Other Ambulatory Visit: Payer: Self-pay

## 2018-03-30 VITALS — BP 130/90 | HR 97 | Temp 99.3°F | Resp 20 | Ht 64.96 in | Wt 217.4 lb

## 2018-03-30 DIAGNOSIS — J320 Chronic maxillary sinusitis: Secondary | ICD-10-CM

## 2018-03-30 MED ORDER — LEVOFLOXACIN 500 MG PO TABS: 500.0000 mg | ORAL_TABLET | Freq: Every day | ORAL | 0 refills | Status: AC

## 2018-03-30 NOTE — Patient Instructions (Addendum)
Please take antibiotic as prescribed. This is a very strong antibiotic. It can raise your blood pressure so keep an eye on that. Do not do any strenuous activity while on this antibiotic as it can make tendons weaker.  I also recommend starting daily zyrtec, afrin for the next 3 days, and nasal saline rinses.   Please contact ENT. Keep me updated.     If you have lab work done today you will be contacted with your lab results within the next 2 weeks.  If you have not heard from us then please contact us. The fastest way to get your results is to register for My Chart.   IF you received an x-ray today, you will receive an invoice from [facility]. Please contact [facility] at [phone] with questions or concerns regarding your invoice.   IF you received labwork today, you will receive an invoice from [facility]. Please contact [facility] at [phone] with questions or concerns regarding your invoice.   Our billing staff will not be able to assist you with questions regarding bills from these companies.  You will be contacted with the lab results as soon as they are available. The fastest way to get your results is to activate your My Chart account. Instructions are located on the last page of this paperwork. If you have not heard from us regarding the results in 2 weeks, please contact this office.

## 2018-03-30 NOTE — Progress Notes (Signed)
le    MRN: [id number] DOB: 07-10-1971  Subjective:   Wendy Shannon is a 47 y.o. female presenting for chief complaint of Sinus Problem (f/u- sinus pressure pain, congestion ) .  Reports ongoing 4 month history of left sided sinus pressure and pain. Notes the nasal passageway on that side is completely blocked. Will have referred teeth pain on that side. Has seen her dentist and was told it was coming from the sinuses, no dental abscess. Tx with prednisone course and augmentin on 02/10/18 and notes that did absolutely nothing for it. Has mild low grade fever and ear pain.Can feel drainage from one sinus cavity to another when she lies down. Denies visual disturbance, headache, confusion,  itchy watery eyes, sore throat, wheezing, shortness of breath, chest tightness, chest pain and myalgia, nausea, vomiting, abdominal pain and diarrhea. Has tried neti pot, singulair, and ibuprofen. The neti pot does not even go through due to the blockage.  Ibuprofen helps with pain relief but she has been having to take it around the clock. Has not had sick contact with anyone. Has history of seasonal allergies and history of asthma. Denies smoking. Denies any other aggravating or relieving factors, no other questions or concerns.  Wendy Shannon has a current medication list which includes the following prescription(s): beclomethasone, citalopram, combivent respimat, diphenoxylate-atropine, hydrochlorothiazide, hydroxyzine, ipratropium-albuterol, montelukast, and levofloxacin. Also is allergic to ace inhibitors.  Wendy Shannon  has a past medical history of Asthma, History of chicken pox, and Hypertension. Also  has a past surgical history that includes Tonsillectomy; Breast surgery (Bilateral, 2007); and Tonsillectomy and adenoidectomy (1984).   Objective:   Vitals: BP 130/90   Pulse 97   Temp 99.3 F (37.4 C) (Oral)   Resp 20   Ht 5' 4.96" (1.65 m)   Wt 217 lb 6.4 oz (98.6 kg)   SpO2 97%   BMI 36.22 kg/m    Physical Exam  Constitutional: She is oriented to person, place, and time. She appears well-developed and well-nourished. No distress.  HENT:  Head: Normocephalic and atraumatic.  Right Ear: Tympanic membrane, external ear and ear canal normal.  Left Ear: Tympanic membrane, external ear and ear canal normal.  Nose: Mucosal edema (mild right side, severe mucosal edema on left side, decreased nasal patency on left side) present. Right sinus exhibits no maxillary sinus tenderness and no frontal sinus tenderness. Left sinus exhibits maxillary sinus tenderness (with applied pressure and leaning head foward). Left sinus exhibits no frontal sinus tenderness.  Mouth/Throat: Uvula is midline and mucous membranes are normal. Normal dentition. No dental abscesses. No posterior oropharyngeal edema, posterior oropharyngeal erythema or tonsillar abscesses. No tonsillar exudate.  Eyes: Pupils are equal, round, and reactive to light. Conjunctivae and EOM are normal.  Neck: Normal range of motion.  Pulmonary/Chest: Effort normal.  Lymphadenopathy:       Head (right side): No submental, no submandibular, no tonsillar, no preauricular, no posterior auricular and no occipital adenopathy present.       Head (left side): No submental, no submandibular, no tonsillar, no preauricular, no posterior auricular and no occipital adenopathy present.    She has no cervical adenopathy.       Right: No supraclavicular adenopathy present.       Left: No supraclavicular adenopathy present.  Neurological: She is alert and oriented to person, place, and time. No cranial nerve deficit.  Skin: Skin is warm and dry.  Psychiatric: She has a normal mood and affect.  Vitals reviewed.   No results  found for this or any previous visit (from the past 24 hour(s)).     Assessment and Plan :  1. Chronic maxillary sinusitis This is been ongoing issue for at least 4 months.  She has failed treatment with Augmentin and prednisone.   She continues to use Neti pot, Singulair, and ibuprofen daily.  No red flag symptoms noted.  Temp is 99.3.  No dental abscess palpated on exam.  I cannot visualize a nasal polyp on exam.  No acute findings on neuro exam.  Physical exam findings are consistent with maxillary sinusitis.  Suggest stepup therapy with Levaquin.  Also recommend adding on daily Zyrtec and nasal saline rinses.  Also suggest using Afrin for the next 3 days to aid with nasal congestion.  If no improvement, do recommend either sinus imaging or further evaluation by ENT.  Patient was like to see ENT.  She does not need a referral.  She will contact them if her symptoms persist despite treatment plan. - levofloxacin (LEVAQUIN) 500 MG tablet; Take 1 tablet (500 mg total) by mouth daily.  Dispense: 7 tablet; Refill: 0   Benjiman CoreBrittany Merlin Ege, PA-C  [facility] 03/30/2018 1:35 PM

## 2018-03-30 NOTE — Telephone Encounter (Signed)
Copied from CRM [id number]. Topic: Quick Communication - See Telephone Encounter >> Mar 30, 2018  1:14 PM [name], Wendy Shannon wrote: CRM for notification. See Telephone encounter for: 03/30/18.   Pharmacy called in for a drug interaction for levofloxacin (LEVAQUIN) 500 MG tablet and citalopram (CELEXA) 20 MG tablet    CB: [phone] -Pharmacy

## 2018-03-31 NOTE — Telephone Encounter (Signed)
Please advise 

## 2018-03-31 NOTE — Telephone Encounter (Signed)
Contacted patient's pharmacy.

## 2018-04-17 ENCOUNTER — Encounter: Payer: Self-pay | Admitting: Physician Assistant

## 2018-04-19 ENCOUNTER — Other Ambulatory Visit: Payer: Self-pay | Admitting: Physician Assistant

## 2018-04-19 ENCOUNTER — Telehealth: Payer: Self-pay | Admitting: Physician Assistant

## 2018-04-19 MED ORDER — SCOPOLAMINE 1 MG/3DAYS TD PT72: 1.0000 | MEDICATED_PATCH | TRANSDERMAL | 0 refills | Status: AC

## 2018-04-19 NOTE — Progress Notes (Signed)
Meds ordered this encounter  Medications  . scopolamine (TRANSDERM SCOP, 1.5 MG,) 1 MG/3DAYS    Sig: Place 1 patch (1.5 mg total) onto the skin every 3 (three) days. Start >4 hours prior to event. Do no cut patch    Dispense:  10 patch    Refill:  0    Order Specific Question:   Supervising Provider    Answer:   Georgina Quint [phone]

## 2018-04-19 NOTE — Telephone Encounter (Signed)
Copied from CRM [id number]. Topic: Quick Communication - Rx Refill/Question >> Apr 19, 2018 12:04 PM Burchel, Abbi R wrote: Medication: Pt reqeusting a patch for motion sickness  [id number] [place], [place] - [address] [place] [address] Phone: [phone] Fax: [phone]   Pt states they are leaving tomorrow for trip.  She is requesting medication for trip.

## 2018-04-19 NOTE — Telephone Encounter (Signed)
Pt requesting a patch for motion sickness.  States they are leaving on trip tomorrow.  She is requesting medication for trip.  [id number] [place], [place] - [address] [place] [address] Phone: [phone] Fax: [phone]

## 2018-04-20 ENCOUNTER — Other Ambulatory Visit: Payer: Self-pay | Admitting: Physician Assistant

## 2018-04-20 DIAGNOSIS — J4521 Mild intermittent asthma with (acute) exacerbation: Secondary | ICD-10-CM

## 2018-04-20 NOTE — Telephone Encounter (Signed)
Requested Prescriptions  Pending Prescriptions Disp Refills  . COMBIVENT RESPIMAT 20-100 MCG/ACT AERS respimat [Pharmacy Med Name: COMBIVENT RESPIMAT 20-100 MCG]  1    Sig: INHALE 1 PUFF INTO THE LUNGS EVERY 6 (SIX) HOURS AS NEEDED FOR WHEEZING.     Pulmonology:  Combination Products Passed - 04/20/2018  9:34 AM      Passed - Valid encounter within last 12 months    Recent Outpatient Visits          3 weeks ago Chronic maxillary sinusitis   [facility] Care at [place], [place], PA-C   2 months ago Acute maxillary sinusitis, recurrence not specified   [facility] Care at [facility], [place], PA-C   5 months ago Anxiety and depression   Primary Care at [place], [place], PA-C   6 months ago Anxiety and depression   Primary Care at [place], [place], PA-C   7 months ago Diarrhea, unspecified type   Primary Care at [facility], [place], [place]

## 2018-05-04 ENCOUNTER — Other Ambulatory Visit: Payer: Self-pay | Admitting: Physician Assistant

## 2018-05-04 DIAGNOSIS — F32A Depression, unspecified: Secondary | ICD-10-CM

## 2018-05-04 DIAGNOSIS — F419 Anxiety disorder, unspecified: Principal | ICD-10-CM

## 2018-05-04 DIAGNOSIS — F329 Major depressive disorder, single episode, unspecified: Secondary | ICD-10-CM

## 2018-05-24 ENCOUNTER — Encounter: Payer: Self-pay | Admitting: Physician Assistant

## 2018-05-25 ENCOUNTER — Other Ambulatory Visit: Payer: Self-pay | Admitting: Physician Assistant

## 2018-05-25 DIAGNOSIS — F419 Anxiety disorder, unspecified: Secondary | ICD-10-CM

## 2018-05-25 DIAGNOSIS — R197 Diarrhea, unspecified: Secondary | ICD-10-CM

## 2018-05-25 DIAGNOSIS — F329 Major depressive disorder, single episode, unspecified: Secondary | ICD-10-CM

## 2018-05-25 DIAGNOSIS — F32A Depression, unspecified: Secondary | ICD-10-CM

## 2018-05-25 DIAGNOSIS — J4521 Mild intermittent asthma with (acute) exacerbation: Secondary | ICD-10-CM

## 2018-05-25 MED ORDER — DIPHENOXYLATE-ATROPINE 2.5-0.025 MG PO TABS: 1.0000 | ORAL_TABLET | Freq: Four times a day (QID) | ORAL | 0 refills | Status: DC | PRN

## 2018-05-25 MED ORDER — DIPHENOXYLATE-ATROPINE 2.5-0.025 MG PO TABS: 1.0000 | ORAL_TABLET | Freq: Four times a day (QID) | ORAL | 0 refills | Status: AC | PRN

## 2018-05-25 MED ORDER — HYDROXYZINE HCL 25 MG PO TABS: 12.5000 mg | ORAL_TABLET | Freq: Three times a day (TID) | ORAL | 0 refills | Status: AC | PRN

## 2018-05-25 NOTE — Progress Notes (Signed)
Meds ordered this encounter  Medications  . DISCONTD: diphenoxylate-atropine (LOMOTIL) 2.5-0.025 MG tablet    Sig: Take 1 tablet by mouth 4 (four) times daily as needed for diarrhea or loose stools.    Dispense:  30 tablet    Refill:  0    Order Specific Question:   Supervising Provider    Answer:   Georgina QuintSAGARDIA, MIGUEL JOSE [id number]]  . hydrOXYzine (ATARAX/VISTARIL) 25 MG tablet    Sig: Take 0.5-1 tablets (12.5-25 mg total) by mouth every 8 (eight) hours as needed for anxiety.    Dispense:  30 tablet    Refill:  0    Order Specific Question:   Supervising Provider    Answer:   Georgina QuintSAGARDIA, MIGUEL JOSE [id number]]  . diphenoxylate-atropine (LOMOTIL) 2.5-0.025 MG tablet    Sig: Take 1 tablet by mouth 4 (four) times daily as needed for diarrhea or loose stools.    Dispense:  30 tablet    Refill:  0    Order Specific Question:   Supervising Provider    Answer:   Georgina QuintSAGARDIA, MIGUEL JOSE [phone][[id number]]

## 2018-06-02 ENCOUNTER — Other Ambulatory Visit: Payer: Self-pay

## 2018-06-02 ENCOUNTER — Encounter: Payer: Self-pay | Admitting: Physician Assistant

## 2018-06-02 ENCOUNTER — Ambulatory Visit (INDEPENDENT_AMBULATORY_CARE_PROVIDER_SITE_OTHER): Payer: BC Managed Care – PPO | Admitting: Physician Assistant

## 2018-06-02 VITALS — BP 132/88 | HR 86 | Temp 98.9°F | Resp 20 | Ht 64.33 in | Wt 218.2 lb

## 2018-06-02 NOTE — Patient Instructions (Signed)
° ° ° °  If you have lab work done today you will be contacted with your lab results within the next 2 weeks.  If you have not heard from us then please contact us. The fastest way to get your results is to register for My Chart. ° ° °IF you received an x-ray today, you will receive an invoice from [facility]. Please contact [facility] at [phone] with questions or concerns regarding your invoice.  ° °IF you received labwork today, you will receive an invoice from [facility]. Please contact [facility] at [phone] with questions or concerns regarding your invoice.  ° °Our billing staff will not be able to assist you with questions regarding bills from these companies. ° °You will be contacted with the lab results as soon as they are available. The fastest way to get your results is to activate your My Chart account. Instructions are located on the last page of this paperwork. If you have not heard from us regarding the results in 2 weeks, please contact this office. °  ° ° ° °

## 2018-06-03 ENCOUNTER — Other Ambulatory Visit: Payer: Self-pay | Admitting: Physician Assistant

## 2018-06-03 ENCOUNTER — Encounter: Payer: Self-pay | Admitting: Physician Assistant

## 2018-06-03 MED ORDER — ALPRAZOLAM 0.5 MG PO TABS: 0.2500 mg | ORAL_TABLET | Freq: Every day | ORAL | 1 refills | Status: DC | PRN

## 2018-06-03 NOTE — Progress Notes (Signed)
Pt left before seeing provider 

## 2018-06-03 NOTE — Progress Notes (Signed)
Opened in error

## 2018-06-03 NOTE — Progress Notes (Signed)
GAD 7 : Generalized Anxiety Score 06/02/2018  Nervous, Anxious, on Edge 3  Control/stop worrying 3  Worry too much - different things 3  Trouble relaxing 3  Restless 0  Easily annoyed or irritable 0  Afraid - awful might happen 3  Total GAD 7 Score 15  Anxiety Difficulty Somewhat difficult    Meds ordered this encounter  Medications  . ALPRAZolam (XANAX) 0.5 MG tablet    Sig: Take 0.5-1 tablets (0.25-0.5 mg total) by mouth daily as needed for anxiety.    Dispense:  30 tablet    Refill:  1    Order Specific Question:   Supervising Provider    Answer:   Georgina QuintSAGARDIA, MIGUEL JOSE [phone][[id number]]

## 2018-06-04 ENCOUNTER — Other Ambulatory Visit: Payer: Self-pay | Admitting: Physician Assistant

## 2018-06-04 ENCOUNTER — Telehealth: Payer: Self-pay | Admitting: Physician Assistant

## 2018-06-04 NOTE — Telephone Encounter (Signed)
Requested medication (s) are due for refill today: YES    Requested medication (s) are on the active medication list: YES  Last refill:  05/25/18   Not Delegated  Future visit scheduled:no  Notes to clinic:  Per pharmacy 0.5mg  unavailable please change to.25mg   Product on backorder    Requested Prescriptions  Pending Prescriptions Disp Refills   ALPRAZolam (XANAX) 0.25 MG tablet [Pharmacy Med Name: ALPRAZOLAM 0.25 MG TABLET] 1 tablet 0    Sig: Please specify directions, refills and quantity     Not Delegated - Psychiatry:  Anxiolytics/Hypnotics Failed - 06/04/2018  8:03 AM      Failed - This refill cannot be delegated      Passed - Urine Drug Screen completed in last 360 days.      Passed - Valid encounter within last 6 months    Recent Outpatient Visits          2 days ago Patient left before evaluation by physician   [facility], [place], PA-C   2 months ago Chronic maxillary sinusitis   [facility], [place], PA-C   3 months ago Acute maxillary sinusitis, recurrence not specified   [facility], [place], PA-C   7 months ago Anxiety and depression   [facility], [place], PA-C   8 months ago Anxiety and depression   [facility]

## 2018-06-04 NOTE — Telephone Encounter (Signed)
Copied from CRM [id number]. Topic: Quick Communication - Rx Refill/Question >> Jun 04, 2018  1:01 PM Fanny Bienlderton, Jessica L wrote: Medication: ALPRAZolam Prudy Feeler(XANAX) 0.5 MG tablet [[id number]][[id number]]  [facility] and stated that 0.5mg  is on back order. [facility]. Can we have RX changed. Please advise

## 2018-06-07 NOTE — Telephone Encounter (Signed)
Prescription sent in 11/21

## 2018-06-07 NOTE — Telephone Encounter (Signed)
Please review for refill for xanax 0.5 mg.  From pharmacy.  Provider (formerly): B. Barnett AbuWiseman, PA  LOV  06/02/18 pt left before provider evaluated her The last office visit before that was 03/30/18.

## 2018-06-08 NOTE — Telephone Encounter (Signed)
Please see pharmacy note.  

## 2018-06-20 ENCOUNTER — Other Ambulatory Visit: Payer: Self-pay | Admitting: Physician Assistant

## 2018-06-20 DIAGNOSIS — I10 Essential (primary) hypertension: Secondary | ICD-10-CM

## 2018-06-21 ENCOUNTER — Other Ambulatory Visit: Payer: Self-pay | Admitting: Physician Assistant

## 2018-06-21 DIAGNOSIS — J4530 Mild persistent asthma, uncomplicated: Secondary | ICD-10-CM

## 2018-06-22 NOTE — Telephone Encounter (Signed)
Pt's last office visit 11/05/17; no office visits noted; left message on voicemail [phone]; when pt calls back please schedule appointment; will grant 30 day refill to cover pt until office visit.   Requested Prescriptions  Pending Prescriptions Disp Refills  . hydrochlorothiazide (MICROZIDE) 12.5 MG capsule [Pharmacy Med Name: HYDROCHLOROTHIAZIDE 12.5 MG CP] 30 capsule 0    Sig: TAKE 1 CAPSULE BY MOUTH EVERY DAY     Cardiovascular: Diuretics - Thiazide Failed - 06/20/2018  9:05 AM      Failed - Valid encounter within last 6 months    Recent Outpatient Visits          2 weeks ago Patient left before evaluation by physician   [facility], [place], PA-C   2 months ago Chronic maxillary sinusitis   [facility], PA-C   4 months ago Acute maxillary sinusitis, recurrence not specified   [facility], [place], PA-C   7 months ago Anxiety and depression   [facility], [place], PA-C   8 months ago Anxiety and depression   [facility], [place], PA-C             Passed - Ca in normal range and within 360 days    Calcium  Date Value Ref Range Status  09/15/2017 9.6 8.9 - 10.3 mg/dL Final         Passed - Cr in normal range and within 360 days    Creat  Date Value Ref Range Status  03/18/2016 0.83 0.50 - 1.10 mg/dL Final   Creatinine, Ser  Date Value Ref Range Status  09/15/2017 0.74 0.44 - 1.00 mg/dL Final         Passed - K in normal range and within 360 days    Potassium  Date Value Ref Range Status  09/15/2017 4.3 3.5 - 5.1 mmol/L Final         Passed - Na in normal range and within 360 days    Sodium  Date Value Ref Range Status  09/15/2017 138 135 - 145 mmol/L Final  09/16/2016 141 134 - 144 mmol/L Final         Passed - Last BP in normal range    BP Readings from Last 1 Encounters:  06/02/18 132/88

## 2018-12-22 ENCOUNTER — Other Ambulatory Visit: Payer: Self-pay | Admitting: Family Medicine

## 2018-12-22 DIAGNOSIS — J4530 Mild persistent asthma, uncomplicated: Secondary | ICD-10-CM

## 2018-12-31 ENCOUNTER — Other Ambulatory Visit: Payer: Self-pay | Admitting: Family Medicine

## 2018-12-31 DIAGNOSIS — J4530 Mild persistent asthma, uncomplicated: Secondary | ICD-10-CM

## 2019-01-16 ENCOUNTER — Other Ambulatory Visit: Payer: Self-pay | Admitting: Family Medicine

## 2019-01-16 DIAGNOSIS — J4530 Mild persistent asthma, uncomplicated: Secondary | ICD-10-CM

## 2019-02-01 ENCOUNTER — Other Ambulatory Visit: Payer: Self-pay | Admitting: Family Medicine

## 2019-02-01 DIAGNOSIS — J4530 Mild persistent asthma, uncomplicated: Secondary | ICD-10-CM

## 2019-08-02 IMAGING — DX DG CHEST 2V
2 series · 2 of 2 positions shown · non-contrast
Comparison: PA and lateral chest x-ray May 02, 2016

CLINICAL DATA: Two months of intermittent cough associated with
shortness of breath and wheezing. History of asthma. Abnormal chest
exam today. Nonsmoker.

EXAM:
CHEST  2 VIEW

[chest pa]
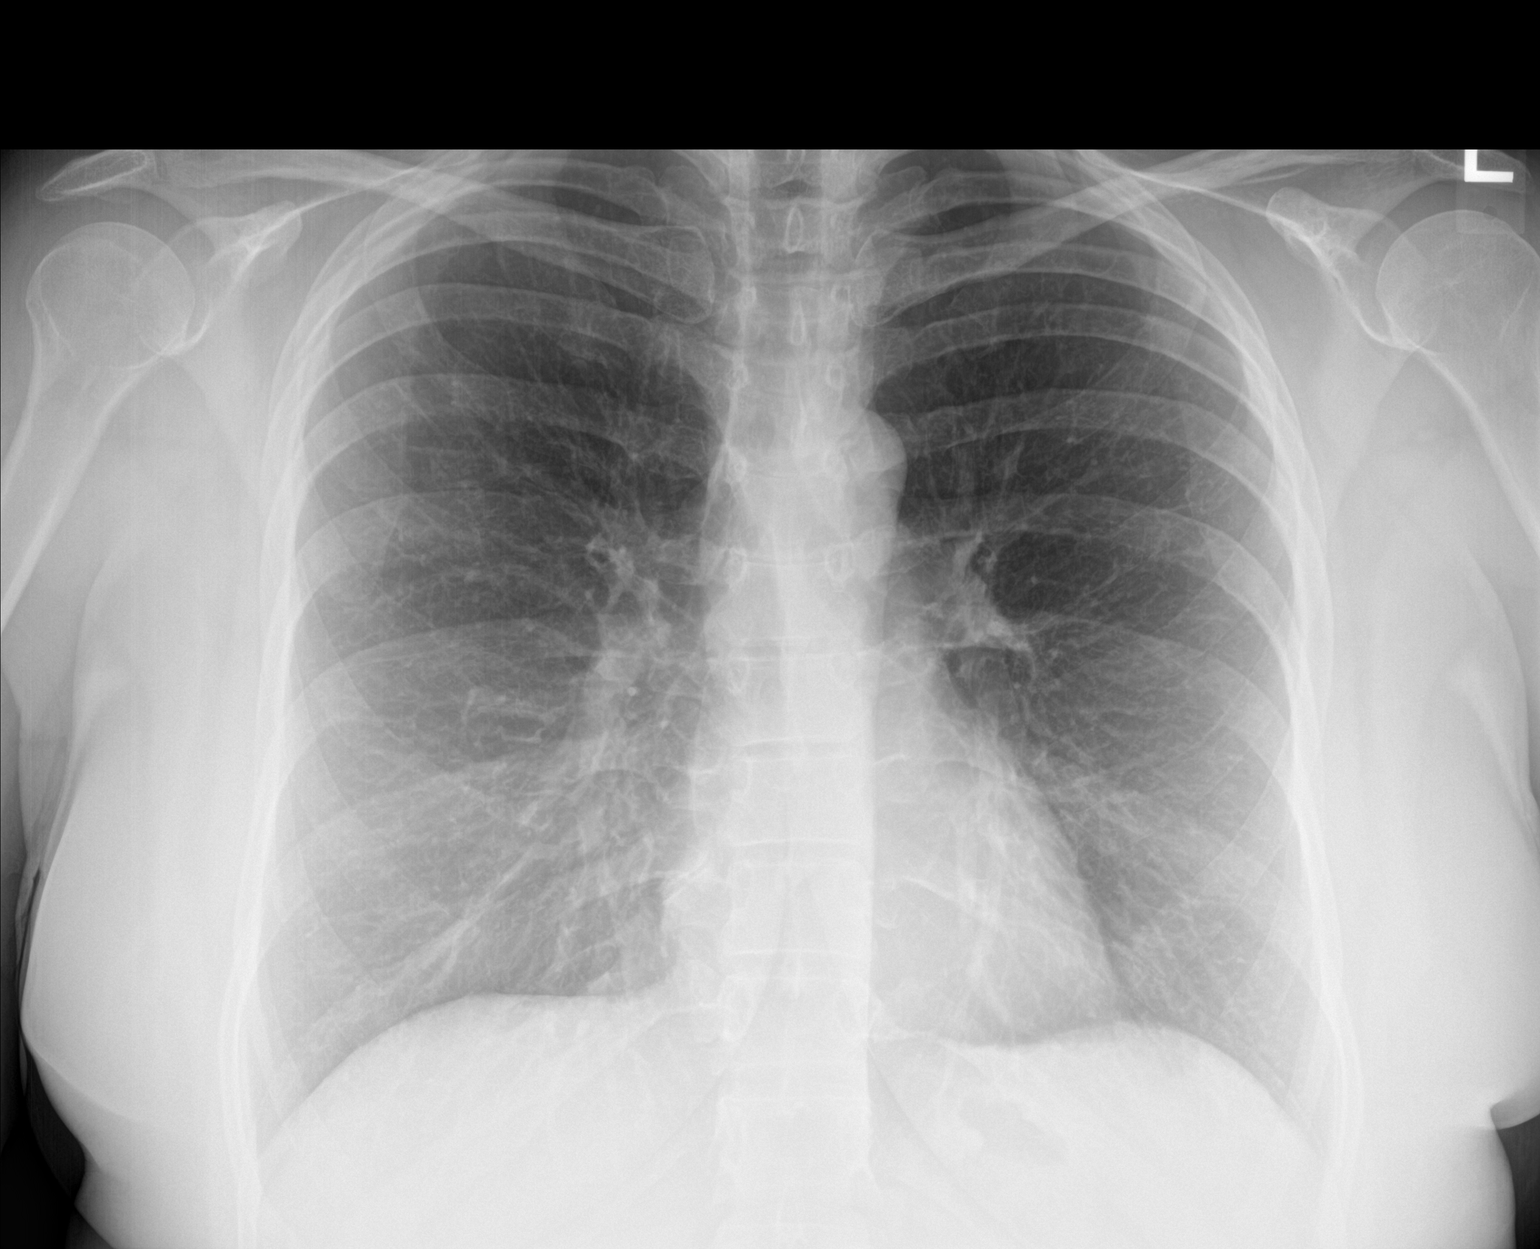

[chest lat]
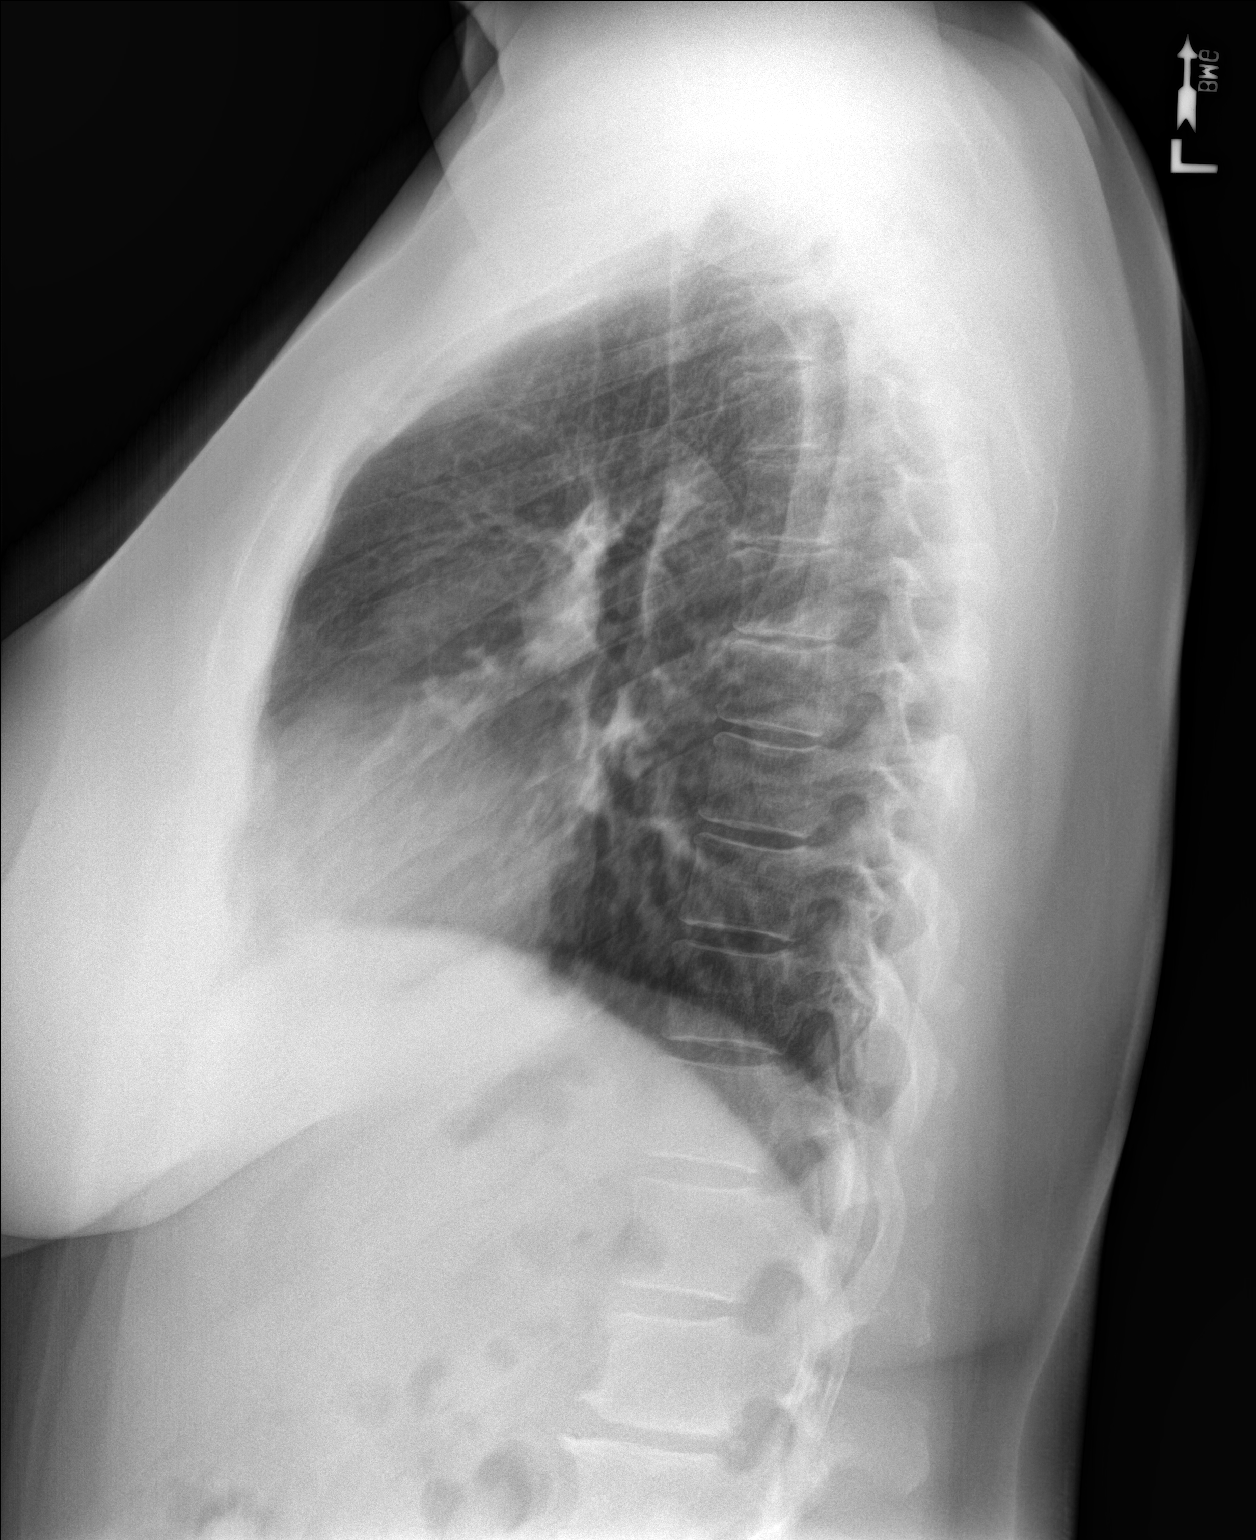

[2 of 2 positions shown; findings below may reference images not displayed]

FINDINGS: Lungs are well-expanded. Motion artifact degrades the lateral film.
There is no alveolar infiltrate or pleural effusion. The
interstitial markings are subjectively mildly increased. The heart
and pulmonary vascularity are normal. The mediastinum is normal in
width. The bony thorax is unremarkable.
IMPRESSION: Findings compatible with reactive airway disease and/or acute
bronchitis. There is no alveolar pneumonia nor CHF.

## 2019-09-10 ENCOUNTER — Ambulatory Visit: Payer: BC Managed Care – PPO | Attending: Internal Medicine

## 2019-09-10 DIAGNOSIS — Z23 Encounter for immunization: Secondary | ICD-10-CM

## 2019-09-10 NOTE — Progress Notes (Signed)
   Covid-19 Vaccination Clinic  Name:  Wendy Shannon    MRN: [id number] DOB: January 11, 1971  09/10/2019  [name] was observed post Covid-19 immunization for 15 minutes without incidence. She was provided with Vaccine Information Sheet and instruction to access the V-Safe system.   [name] was instructed to call 911 with any severe reactions post vaccine: Marland Kitchen Difficulty breathing  . Swelling of your face and throat  . A fast heartbeat  . A bad rash all over your body  . Dizziness and weakness    Immunizations Administered    Name Date Dose VIS Date Route   Pfizer COVID-19 Vaccine 09/10/2019  3:14 PM 0.3 mL 06/24/2019 Intramuscular   Manufacturer: [employer], [employer]   Lot: [id number]   NDC: [address]-5

## 2019-10-01 ENCOUNTER — Ambulatory Visit: Payer: BC Managed Care – PPO | Attending: Internal Medicine

## 2019-10-01 DIAGNOSIS — Z23 Encounter for immunization: Secondary | ICD-10-CM

## 2019-10-01 NOTE — Progress Notes (Signed)
   Covid-19 Vaccination Clinic  Name:  Wendy Shannon    MRN: [id number] DOB: [birth year]  10/01/2019  Wendy Shannon was observed post Covid-19 immunization for 15 minutes without incident. She was provided with Vaccine Information Sheet and instruction to access the V-Safe system.   Wendy Shannon was instructed to call 911 with any severe reactions post vaccine: Marland Kitchen Difficulty breathing  . Swelling of face and throat  . A fast heartbeat  . A bad rash all over body  . Dizziness and weakness   Immunizations Administered    Name Date Dose VIS Date Route   Pfizer COVID-19 Vaccine 10/01/2019  8:22 AM 0.3 mL 06/24/2019 Intramuscular   Manufacturer: [employer], [employer]   Lot: [id number]   NDC: [address]-1

## 2019-10-05 IMAGING — DX DG CHEST 2V
2 series · 2 of 2 positions shown · non-contrast
Comparison: 07/13/2017

CLINICAL DATA: Elevated blood pressure.  Chest tightness

EXAM:
CHEST  2 VIEW

[chest pa]
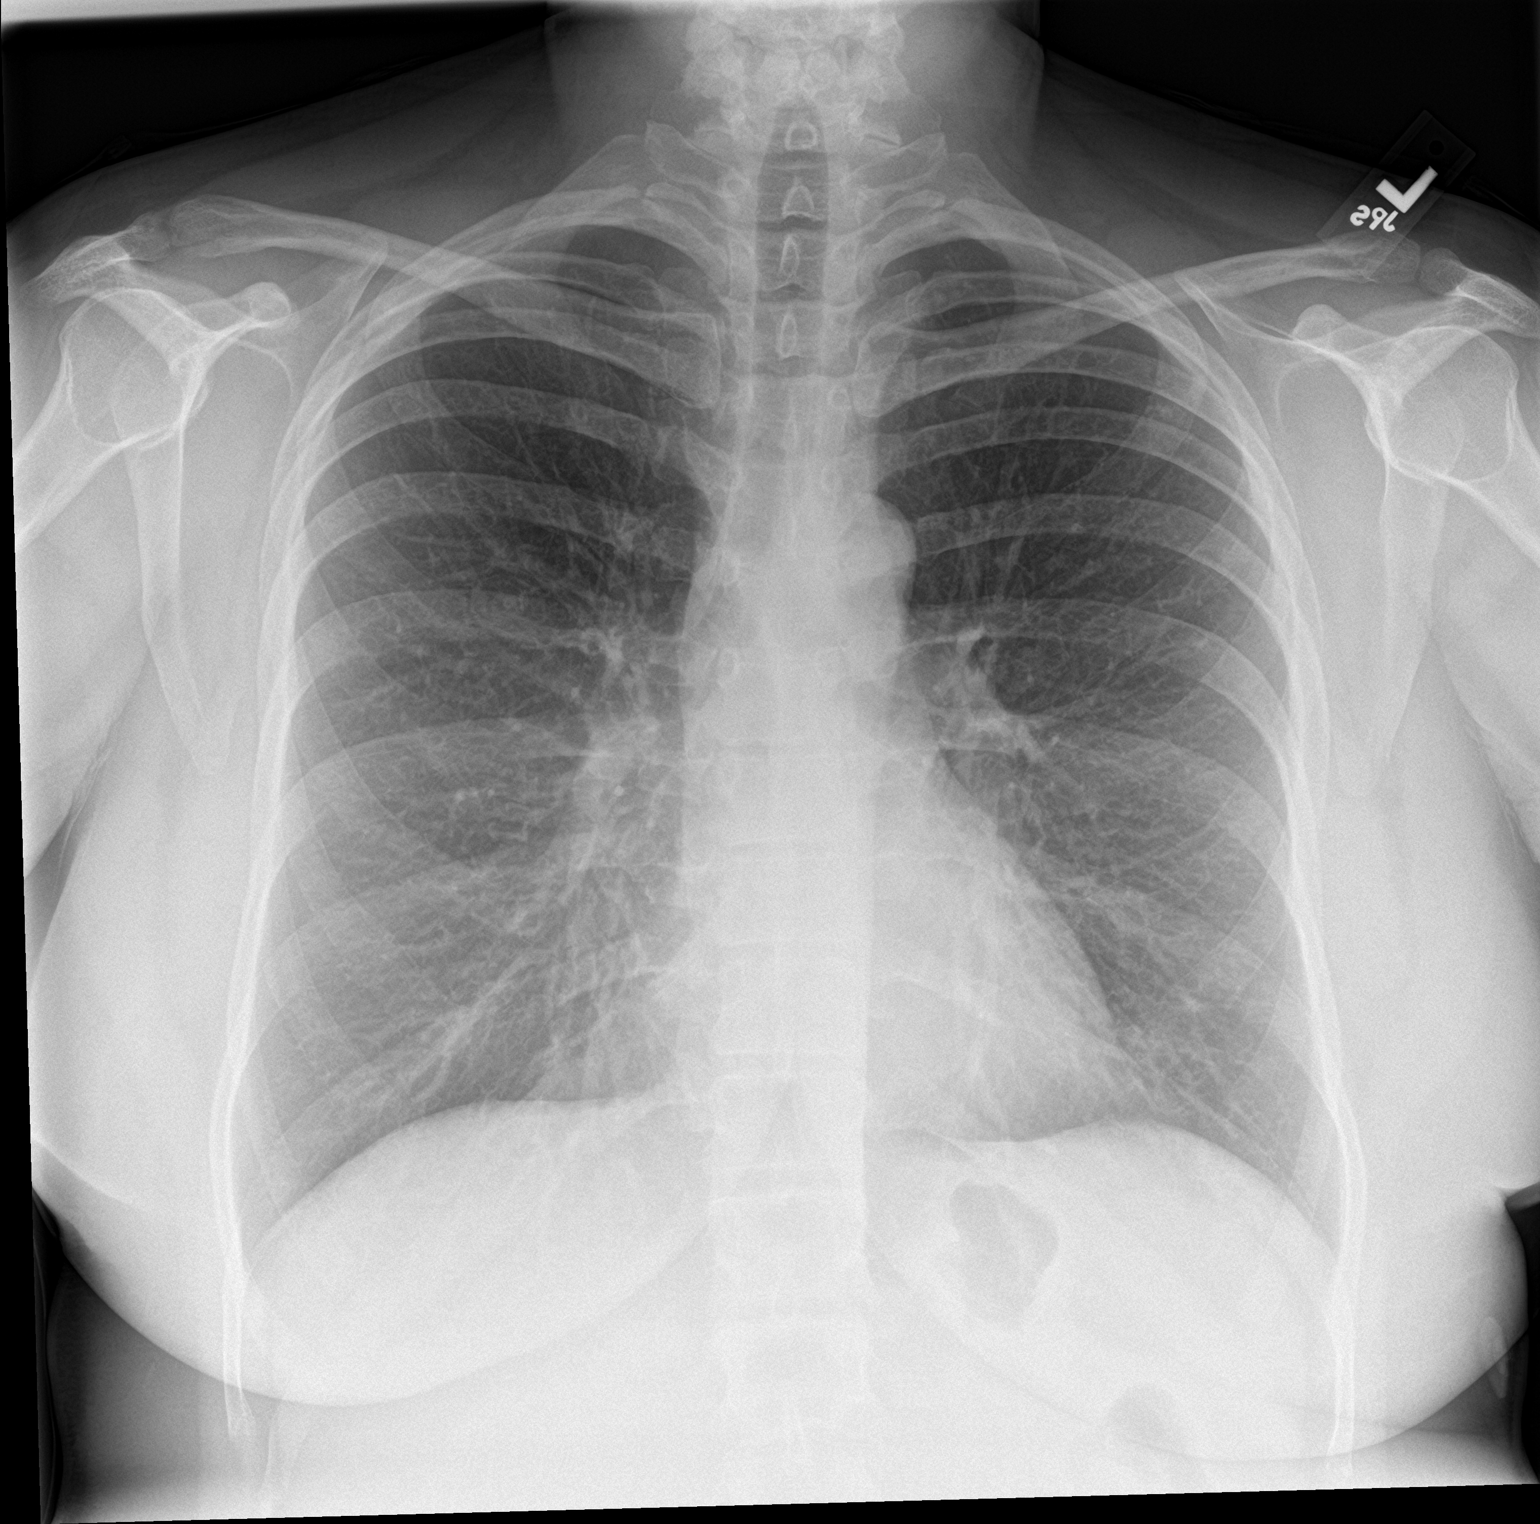

[chest lat]
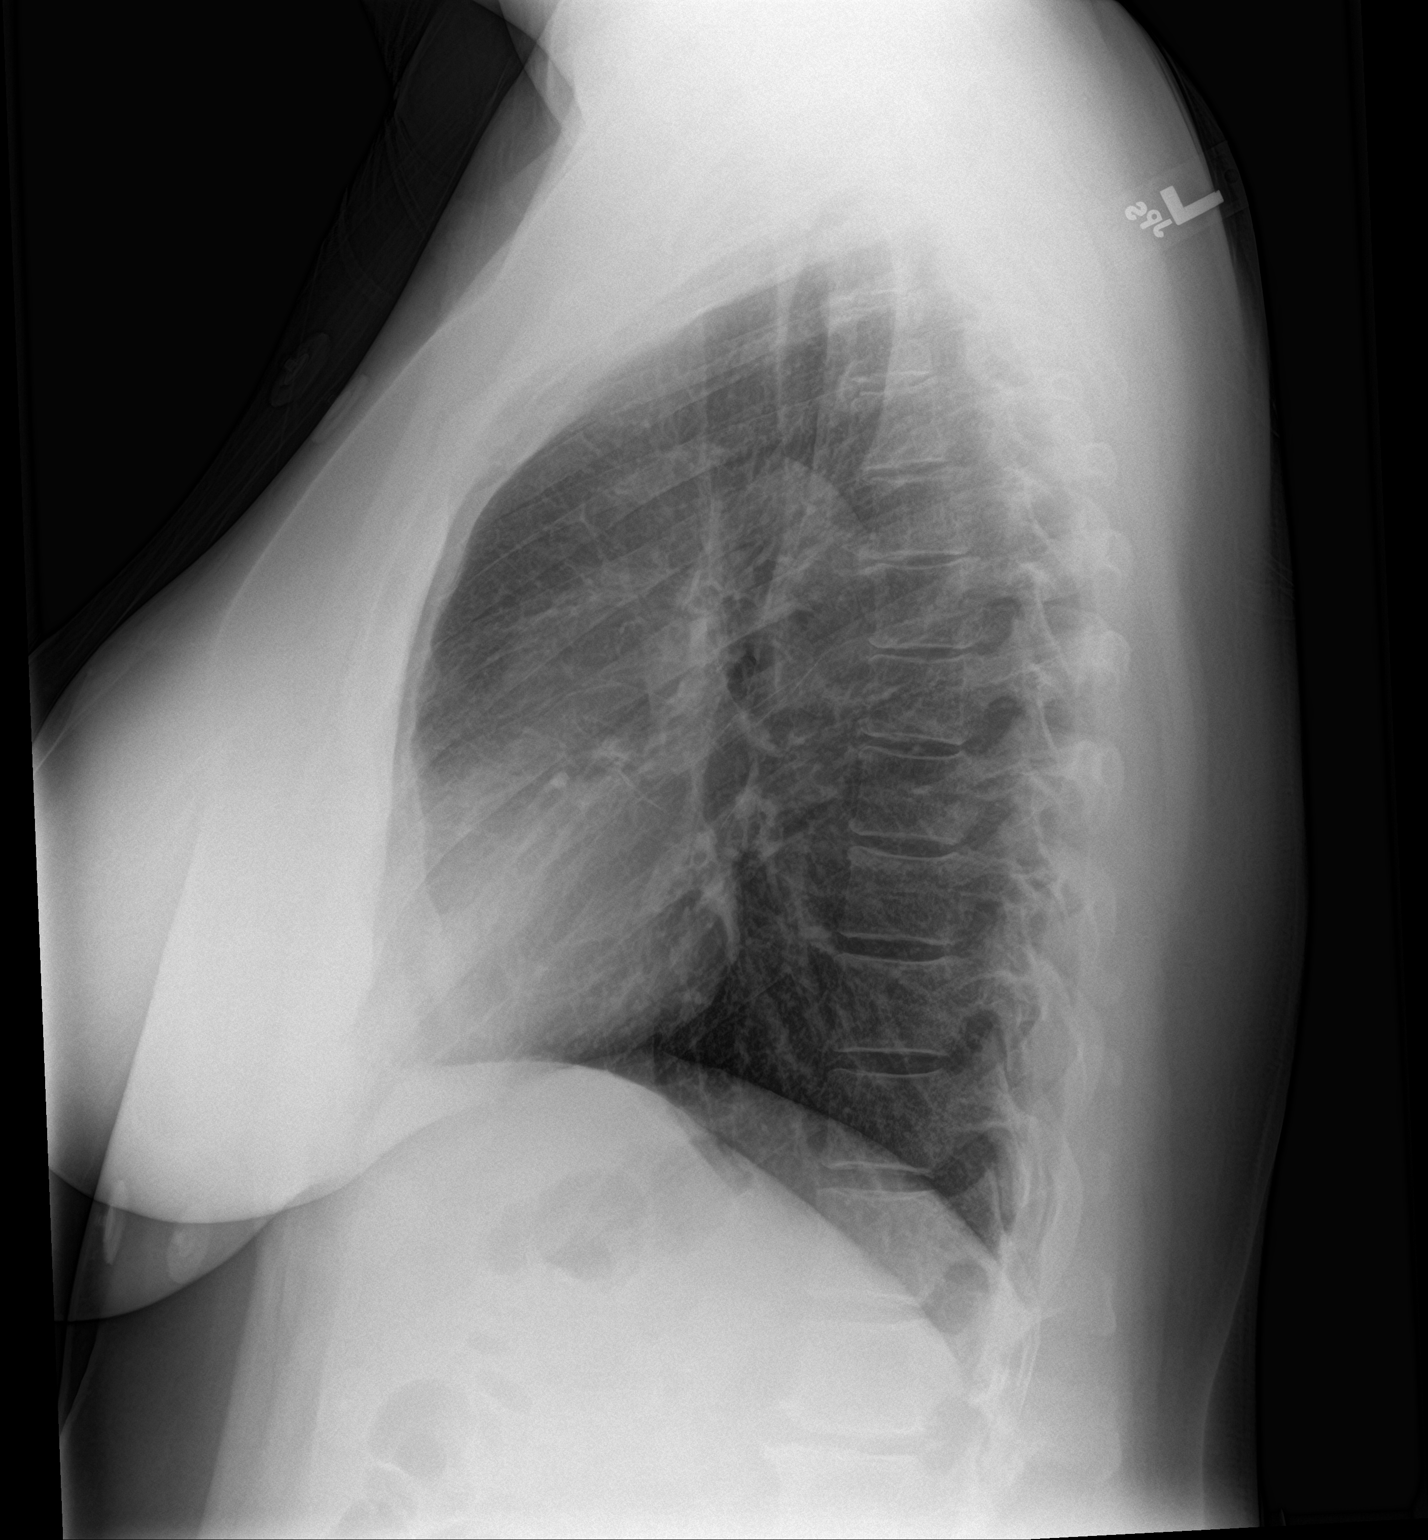

[2 of 2 positions shown; findings below may reference images not displayed]

FINDINGS: The heart size and mediastinal contours are within normal limits.
Both lungs are clear. The visualized skeletal structures are
unremarkable.
IMPRESSION: No active cardiopulmonary disease.

## 2021-04-08 LAB — EXTERNAL GENERIC LAB PROCEDURE: COLOGUARD: NEGATIVE

## 2021-04-08 LAB — COLOGUARD: COLOGUARD: NEGATIVE

## 2023-10-27 ENCOUNTER — Emergency Department (HOSPITAL_COMMUNITY)
Admission: EM | Admit: 2023-10-27 | Discharge: 2023-10-28 | Disposition: A | Discharge status: Home / Self Care | Attending: Emergency Medicine | Admitting: Emergency Medicine

## 2023-10-27 ENCOUNTER — Emergency Department (HOSPITAL_COMMUNITY)

## 2023-10-27 DIAGNOSIS — R1013 Epigastric pain: Secondary | ICD-10-CM | POA: Diagnosis not present

## 2023-10-27 DIAGNOSIS — R079 Chest pain, unspecified: Secondary | ICD-10-CM | POA: Diagnosis present

## 2023-10-27 LAB — CBC
HCT: 45.8 % (ref 36.0–46.0)
Hemoglobin: 14.8 g/dL (ref 12.0–15.0)
MCH: 28.4 pg (ref 26.0–34.0)
MCHC: 32.3 g/dL (ref 30.0–36.0)
MCV: 87.7 fL (ref 80.0–100.0)
Platelets: 376 10*3/uL (ref 150–400)
RBC: 5.22 MIL/uL — ABNORMAL HIGH (ref 3.87–5.11)
RDW: 13.2 % (ref 11.5–15.5)
WBC: 11.6 10*3/uL — ABNORMAL HIGH (ref 4.0–10.5)
nRBC: 0 % (ref 0.0–0.2)

## 2023-10-27 LAB — RESP PANEL BY RT-PCR (RSV, FLU A&B, COVID)  RVPGX2
Influenza A by PCR: NEGATIVE
Influenza B by PCR: NEGATIVE
Resp Syncytial Virus by PCR: NEGATIVE
SARS Coronavirus 2 by RT PCR: NEGATIVE

## 2023-10-27 LAB — BASIC METABOLIC PANEL WITH GFR
Anion gap: 12 (ref 5–15)
BUN: 8 mg/dL (ref 6–20)
CO2: 27 mmol/L (ref 22–32)
Calcium: 9.4 mg/dL (ref 8.9–10.3)
Chloride: 101 mmol/L (ref 98–111)
Creatinine, Ser: 0.77 mg/dL (ref 0.44–1.00)
GFR, Estimated: >60 mL/min (ref >60)
Glucose, Bld: 155 mg/dL — ABNORMAL HIGH (ref 70–99)
Potassium: 3.6 mmol/L (ref 3.5–5.1)
Sodium: 140 mmol/L (ref 135–145)

## 2023-10-27 LAB — TROPONIN I (HIGH SENSITIVITY): Troponin I (High Sensitivity): 5 ng/L (ref <18)

## 2023-10-27 MED ORDER — ALUM & MAG HYDROXIDE-SIMETH 200-200-20 MG/5ML PO SUSP
30.0000 mL | Freq: Once | ORAL | Status: AC
  Administered 2023-10-27: 30 mL via ORAL
  Filled 2023-10-27: qty 30

## 2023-10-27 MED ORDER — ASPIRIN 81 MG PO CHEW
324.0000 mg | CHEWABLE_TABLET | Freq: Once | ORAL | Status: AC
  Administered 2023-10-27: 324 mg via ORAL
  Filled 2023-10-27: qty 4

## 2023-10-27 NOTE — ED Triage Notes (Signed)
 Patient complains of chest pain that come on suddenly while driving home, and vomited once again while at home. States she had a couple of episodes of same earlier in the week. Pain is in back and radiates around to her left and right side, and chest. Chest pain is a tightness and pressure. Rates pain 1-2 at present, but during pain episodes it is like an 8. Patient states she has been out of breath and more short of breath this week than normal. Has a hx of asthma. Family hx of MI- her dad passed away of a MI in his 38's.

## 2023-10-28 ENCOUNTER — Emergency Department (HOSPITAL_COMMUNITY)

## 2023-10-28 ENCOUNTER — Encounter (HOSPITAL_COMMUNITY): Payer: Self-pay

## 2023-10-28 LAB — HEPATIC FUNCTION PANEL
ALT: 24 U/L (ref 0–44)
AST: 31 U/L (ref 15–41)
Albumin: 3.8 g/dL (ref 3.5–5.0)
Alkaline Phosphatase: 68 U/L (ref 38–126)
Bilirubin, Direct: <0.1 mg/dL (ref 0.0–0.2)
Total Bilirubin: 0.7 mg/dL (ref 0.0–1.2)
Total Protein: 7.4 g/dL (ref 6.5–8.1)

## 2023-10-28 LAB — LIPASE, BLOOD: Lipase: 68 U/L — ABNORMAL HIGH (ref 11–51)

## 2023-10-28 LAB — TROPONIN I (HIGH SENSITIVITY): Troponin I (High Sensitivity): 5 ng/L (ref <18)

## 2023-10-28 LAB — D-DIMER, QUANTITATIVE: D-Dimer, Quant: <0.27 ug{FEU}/mL (ref 0.00–0.50)

## 2023-10-28 MED ORDER — IOHEXOL 300 MG/ML  SOLN
100.0000 mL | Freq: Once | INTRAMUSCULAR | Status: AC | PRN
  Administered 2023-10-28: 100 mL via INTRAVENOUS

## 2023-10-28 NOTE — ED Provider Notes (Signed)
 [facility] Emergency Department Provider Note MRN:  [id number]  Arrival date & time: 10/28/23     Chief Complaint   Chest Pain   History of Present Illness   Wendy Shannon is a 53 y.o. year-old female presents to the ED with chief complaint of epigastric pain and chest pain that radiates around into her back and beneath her breasts.  She states that she had a similar episode earlier this week.  She states that her symptoms are no mild to essentially resolved.  She does report history of asthma, and states that she has felt short of breath sometimes this week.  She states that she is nervous because her father passed away from a MI at the age 38.  She denies fevers, chills, but has had nausea and vomiting.  History provided by patient.   Review of Systems  Pertinent positive and negative review of systems noted in HPI.    Physical Exam   Vitals:   10/28/23 0200 10/28/23 0214  BP: 132/84   Pulse: 78   Resp: 13   Temp:  98 F (36.7 C)  SpO2: 96%     CONSTITUTIONAL:  well-appearing, NAD NEURO:  Alert and oriented x 3, CN 3-12 grossly intact EYES:  eyes equal and reactive ENT/NECK:  Supple, no stridor  CARDIO:  normal rate, regular rhythm, appears well-perfused  PULM:  No respiratory distress, CTAB GI/GU:  non-distended, no focal abdominal tenderness, no Murphy sign MSK/SPINE:  No gross deformities, no edema, moves all extremities  SKIN:  no rash, atraumatic   *Additional and/or pertinent findings included in MDM below  Diagnostic and Interventional Summary    EKG Interpretation Date/Time:  Tuesday October 27 2023 22:05:28 EDT Ventricular Rate:  85 PR Interval:  154 QRS Duration:  91 QT Interval:  399 QTC Calculation: 475 R Axis:   16  Text Interpretation: Sinus rhythm Low voltage, precordial leads Baseline wander in lead(s) V1 No significant change since last tracing Confirmed by Kelsey Patricia [phone]) on 10/28/2023 3:38:05 AM        Labs Reviewed  BASIC METABOLIC PANEL WITH GFR - Abnormal; Notable for the following components:      Result Value   Glucose, Bld 155 (*)    All other components within normal limits  CBC - Abnormal; Notable for the following components:   WBC 11.6 (*)    RBC 5.22 (*)    All other components within normal limits  LIPASE, BLOOD - Abnormal; Notable for the following components:   Lipase 68 (*)    All other components within normal limits  RESP PANEL BY RT-PCR (RSV, FLU A&B, COVID)  [id number]  D-DIMER, QUANTITATIVE  HEPATIC FUNCTION PANEL  TROPONIN I (HIGH SENSITIVITY)  TROPONIN I (HIGH SENSITIVITY)    CT ABDOMEN PELVIS W CONTRAST  Final Result    DG Chest 2 View  Final Result      Medications  alum & mag hydroxide-simeth (MAALOX/MYLANTA) 200-200-20 MG/5ML suspension 30 mL (30 mLs Oral Given 10/27/23 2350)  aspirin chewable tablet 324 mg (324 mg Oral Given 10/27/23 2350)  iohexol (OMNIPAQUE) 300 MG/ML solution 100 mL (100 mLs Intravenous Contrast Given 10/28/23 0217)     Procedures  /  Critical Care Procedures  ED Course and Medical Decision Making  I have reviewed the triage vital signs, the nursing notes, and pertinent available records from the EMR.  Social Determinants Affecting Complexity of Care: Patient has no clinically significant social determinants affecting this chief complaint.Aaron Aas  ED Course: Clinical Course as of 10/28/23 0342  Wed Oct 28, 2023  0338 CBC(!) Mild leukocytosis to 11.6, no anemia [RB]  0338 Basic metabolic panel(!) Normal electrolytes, normal creatinine [RB]  0338 Hepatic function panel LFTs added due to epigastric discomfort, these are normal, doubt gallbladder etiology [RB]  0338 Lipase, blood(!) Lipase is mildly elevated at 68, will check CT to rule out pancreatitis [RB]  0339 D-dimer, quantitative D-dimer is negative, doubt PE [RB]  0339 Troponin I (High Sensitivity) Troponins are flat at 5.  No ischemic EKG changes.  Doubt ACS.   Symptom-free now. [RB]  0339 EKG 12-Lead No acute ischemic EKG changes [RB]  0340 DG Chest 2 View No obvious effusion or opacity [RB]    Clinical Course User Index [RB] Sherel Dikes, PA-C    Medical Decision Making Patient here with epigastric abdominal pain/inferior chest pain that radiates to her back.  She states that her symptoms have improved.  She is concerned about ACS due to early family history.  Will check labs, EKG, and reassess.  She has had some nausea and vomiting tonight as well.  She does not have any focal tenderness on my exam.  She feels improved after the vomiting.  Labs and imaging are discussed and ED course.  With reassuring workup, I doubt ACS, PE, pancreatitis, or gallbladder etiology.  Uncertain cause of this patient's symptoms.  I discussed the results with the patient.  She will follow-up with her primary care doctor.  Return precautions discussed.  She is stable and ready for discharge.  Amount and/or Complexity of Data Reviewed Labs: ordered. Decision-making details documented in ED Course. Radiology: ordered. Decision-making details documented in ED Course. ECG/medicine tests:  Decision-making details documented in ED Course.  Risk OTC drugs. Prescription drug management.         Consultants: No consultations were needed in caring for this patient.   Treatment and Plan: I considered admission due to patient's initial presentation, but after considering the examination and diagnostic results, patient will not require admission and can be discharged with outpatient follow-up.    Final Clinical Impressions(s) / ED Diagnoses     ICD-10-CM   1. Chest pain, unspecified type  R07.9     2. Epigastric pain  R10.13       ED Discharge Orders     None         Discharge Instructions Discussed with and Provided to Patient:   Discharge Instructions   None      Sherel Dikes, PA-C 10/28/23 0342    Kelsey Patricia, MD 10/28/23  [phone]

## 2024-05-05 LAB — COLOGUARD: COLOGUARD: NEGATIVE

## 2024-05-30 ENCOUNTER — Other Ambulatory Visit (HOSPITAL_COMMUNITY): Payer: Self-pay

## 2024-05-30 DIAGNOSIS — M7662 Achilles tendinitis, left leg: Secondary | ICD-10-CM

## 2024-05-30 DIAGNOSIS — M25572 Pain in left ankle and joints of left foot: Secondary | ICD-10-CM

## 2024-05-30 DIAGNOSIS — R2242 Localized swelling, mass and lump, left lower limb: Secondary | ICD-10-CM

## 2024-05-30 DIAGNOSIS — M25872 Other specified joint disorders, left ankle and foot: Secondary | ICD-10-CM

## 2024-05-31 ENCOUNTER — Ambulatory Visit (HOSPITAL_COMMUNITY)
Admission: RE | Admit: 2024-05-31 | Discharge: 2024-05-31 | Disposition: A | Discharge status: Home / Self Care | Source: Ambulatory Visit

## 2024-05-31 DIAGNOSIS — M25872 Other specified joint disorders, left ankle and foot: Secondary | ICD-10-CM | POA: Insufficient documentation

## 2024-05-31 DIAGNOSIS — R2242 Localized swelling, mass and lump, left lower limb: Secondary | ICD-10-CM | POA: Diagnosis present

## 2024-05-31 DIAGNOSIS — M25572 Pain in left ankle and joints of left foot: Secondary | ICD-10-CM | POA: Diagnosis present

## 2024-05-31 DIAGNOSIS — M7662 Achilles tendinitis, left leg: Secondary | ICD-10-CM | POA: Diagnosis present

## 2024-05-31 MED ORDER — GADOBUTROL 1 MMOL/ML IV SOLN
10.0000 mL | Freq: Once | INTRAVENOUS | Status: AC | PRN
  Administered 2024-05-31: 10 mL via INTRAVENOUS
